# Patient Record
Sex: Male | Born: 1955 | Race: White | Hispanic: No | Marital: Married | Smoking: Never smoker
Health system: Southern US, Community
[De-identification: ages and names within clinical notes are randomized; demographics above are authoritative.]

## PROBLEM LIST (undated history)

## (undated) DIAGNOSIS — Z955 Presence of coronary angioplasty implant and graft: Secondary | ICD-10-CM

## (undated) DIAGNOSIS — M2550 Pain in unspecified joint: Secondary | ICD-10-CM

## (undated) DIAGNOSIS — R001 Bradycardia, unspecified: Secondary | ICD-10-CM

## (undated) DIAGNOSIS — E785 Hyperlipidemia, unspecified: Secondary | ICD-10-CM

## (undated) DIAGNOSIS — I519 Heart disease, unspecified: Secondary | ICD-10-CM

## (undated) DIAGNOSIS — I2109 ST elevation (STEMI) myocardial infarction involving other coronary artery of anterior wall: Secondary | ICD-10-CM

## (undated) DIAGNOSIS — I2510 Atherosclerotic heart disease of native coronary artery without angina pectoris: Secondary | ICD-10-CM

## (undated) DIAGNOSIS — I252 Old myocardial infarction: Secondary | ICD-10-CM

## (undated) HISTORY — DX: Presence of coronary angioplasty implant and graft: Z95.5

## (undated) HISTORY — DX: ST elevation (STEMI) myocardial infarction involving other coronary artery of anterior wall: I21.09

## (undated) HISTORY — PX: VASECTOMY: SHX75

## (undated) HISTORY — DX: Bradycardia, unspecified: R00.1

## (undated) HISTORY — DX: Heart disease, unspecified: I51.9

## (undated) HISTORY — DX: Pain in unspecified joint: M25.50

## (undated) HISTORY — DX: Hyperlipidemia, unspecified: E78.5

## (undated) HISTORY — DX: Old myocardial infarction: I25.2

## (undated) HISTORY — DX: Atherosclerotic heart disease of native coronary artery without angina pectoris: I25.10

---

## 2003-04-04 ENCOUNTER — Emergency Department (HOSPITAL_COMMUNITY): Admission: EM | Admit: 2003-04-04 | Discharge: 2003-04-04 | Payer: Self-pay | Admitting: Emergency Medicine

## 2003-04-04 ENCOUNTER — Encounter: Payer: Self-pay | Admitting: Emergency Medicine

## 2011-02-17 ENCOUNTER — Emergency Department (HOSPITAL_COMMUNITY): Payer: 59

## 2011-02-17 ENCOUNTER — Inpatient Hospital Stay (HOSPITAL_COMMUNITY)
Admission: EM | Admit: 2011-02-17 | Discharge: 2011-02-19 | DRG: 249 | Disposition: A | Discharge status: Home / Self Care | Payer: 59 | Attending: Cardiology | Admitting: Cardiology

## 2011-02-17 ENCOUNTER — Inpatient Hospital Stay (HOSPITAL_COMMUNITY): Payer: 59

## 2011-02-17 DIAGNOSIS — I498 Other specified cardiac arrhythmias: Secondary | ICD-10-CM | POA: Diagnosis present

## 2011-02-17 DIAGNOSIS — I2510 Atherosclerotic heart disease of native coronary artery without angina pectoris: Secondary | ICD-10-CM

## 2011-02-17 DIAGNOSIS — M549 Dorsalgia, unspecified: Secondary | ICD-10-CM | POA: Diagnosis present

## 2011-02-17 DIAGNOSIS — Z955 Presence of coronary angioplasty implant and graft: Secondary | ICD-10-CM

## 2011-02-17 DIAGNOSIS — I519 Heart disease, unspecified: Secondary | ICD-10-CM | POA: Diagnosis present

## 2011-02-17 DIAGNOSIS — I252 Old myocardial infarction: Secondary | ICD-10-CM

## 2011-02-17 DIAGNOSIS — Z8249 Family history of ischemic heart disease and other diseases of the circulatory system: Secondary | ICD-10-CM

## 2011-02-17 DIAGNOSIS — I2109 ST elevation (STEMI) myocardial infarction involving other coronary artery of anterior wall: Principal | ICD-10-CM | POA: Diagnosis present

## 2011-02-17 DIAGNOSIS — IMO0002 Reserved for concepts with insufficient information to code with codable children: Secondary | ICD-10-CM | POA: Diagnosis present

## 2011-02-17 DIAGNOSIS — E785 Hyperlipidemia, unspecified: Secondary | ICD-10-CM | POA: Diagnosis present

## 2011-02-17 DIAGNOSIS — Y840 Cardiac catheterization as the cause of abnormal reaction of the patient, or of later complication, without mention of misadventure at the time of the procedure: Secondary | ICD-10-CM | POA: Diagnosis present

## 2011-02-17 DIAGNOSIS — R0789 Other chest pain: Secondary | ICD-10-CM

## 2011-02-17 HISTORY — PX: CORONARY STENT PLACEMENT: SHX1402

## 2011-02-17 HISTORY — DX: Old myocardial infarction: I25.2

## 2011-02-17 HISTORY — DX: Presence of coronary angioplasty implant and graft: Z95.5

## 2011-02-17 LAB — BASIC METABOLIC PANEL
BUN: 20 mg/dL (ref 6–23)
CO2: 24 mEq/L (ref 19–32)
Calcium: 9.1 mg/dL (ref 8.4–10.5)
Creatinine, Ser: 0.83 mg/dL (ref 0.4–1.5)
GFR calc non Af Amer: >60 mL/min (ref >60)
Glucose, Bld: 163 mg/dL — ABNORMAL HIGH (ref 70–99)

## 2011-02-17 LAB — COMPREHENSIVE METABOLIC PANEL
BUN: 18 mg/dL (ref 6–23)
CO2: 22 mEq/L (ref 19–32)
Chloride: 98 mEq/L (ref 96–112)
Creatinine, Ser: 0.65 mg/dL (ref 0.4–1.5)
GFR calc non Af Amer: >60 mL/min (ref >60)
Total Bilirubin: 1 mg/dL (ref 0.3–1.2)

## 2011-02-17 LAB — DIFFERENTIAL
Basophils Absolute: 0 10*3/uL (ref 0.0–0.1)
Basophils Relative: 0 % (ref 0–1)
Eosinophils Relative: 3 % (ref 0–5)
Lymphocytes Relative: 34 % (ref 12–46)
Monocytes Absolute: 0.4 10*3/uL (ref 0.1–1.0)
Neutro Abs: 5 10*3/uL (ref 1.7–7.7)

## 2011-02-17 LAB — BRAIN NATRIURETIC PEPTIDE: Pro B Natriuretic peptide (BNP): <30 pg/mL (ref 0.0–100.0)

## 2011-02-17 LAB — CBC
HCT: 42.9 % (ref 39.0–52.0)
MCHC: 34.5 g/dL (ref 30.0–36.0)
RDW: 12.6 % (ref 11.5–15.5)
WBC: 8.6 10*3/uL (ref 4.0–10.5)

## 2011-02-17 LAB — POCT CARDIAC MARKERS
CKMB, poc: 2.6 ng/mL (ref 1.0–8.0)
Myoglobin, poc: 106 ng/mL (ref 12–200)

## 2011-02-17 LAB — APTT: aPTT: 24 seconds (ref 24–37)

## 2011-02-17 LAB — TSH: TSH: 2.113 u[IU]/mL (ref 0.350–4.500)

## 2011-02-17 LAB — CARDIAC PANEL(CRET KIN+CKTOT+MB+TROPI)
CK, MB: 6.1 ng/mL (ref 0.3–4.0)
CK, MB: 106.7 ng/mL (ref 0.3–4.0)
Total CK: 195 U/L (ref 7–232)
Total CK: 899 U/L — ABNORMAL HIGH (ref 7–232)

## 2011-02-17 LAB — PROTIME-INR: INR: 1 (ref 0.00–1.49)

## 2011-02-18 DIAGNOSIS — I219 Acute myocardial infarction, unspecified: Secondary | ICD-10-CM

## 2011-02-18 DIAGNOSIS — I2109 ST elevation (STEMI) myocardial infarction involving other coronary artery of anterior wall: Secondary | ICD-10-CM

## 2011-02-18 LAB — CARDIAC PANEL(CRET KIN+CKTOT+MB+TROPI)
Relative Index: 8.2 — ABNORMAL HIGH (ref 0.0–2.5)
Total CK: 1921 U/L — ABNORMAL HIGH (ref 7–232)

## 2011-02-18 LAB — LIPID PANEL
Cholesterol: 194 mg/dL (ref 0–200)
HDL: 40 mg/dL (ref >39)

## 2011-02-18 LAB — BASIC METABOLIC PANEL
BUN: 16 mg/dL (ref 6–23)
GFR calc non Af Amer: >60 mL/min (ref >60)
Potassium: 3.9 mEq/L (ref 3.5–5.1)
Sodium: 136 mEq/L (ref 135–145)

## 2011-02-18 LAB — CBC
Platelets: 242 10*3/uL (ref 150–400)
RDW: 12.9 % (ref 11.5–15.5)
WBC: 14.4 10*3/uL — ABNORMAL HIGH (ref 4.0–10.5)

## 2011-02-18 NOTE — Procedures (Signed)
NAME:  Patrick Berg, DUBREE NO.:  [id number]  MEDICAL RECORD NO.:  [id number]           PATIENT TYPE:  E  LOCATION:  [facility]                         FACILITY:  [facility]  PHYSICIAN:  Kentaro Alewine M. [place], M.D.  DATE OF BIRTH:  [birth year]  DATE OF PROCEDURE:  02/17/2011 DATE OF DISCHARGE:                           CARDIAC CATHETERIZATION   INDICATIONS FOR PROCEDURE:  A 55 year old white male with history of hypercholesterolemia who presents with an acute ST elevation anterior myocardial infarction.  The patient has marked ST elevation in the anterior precordial leads.  PROCEDURES:  Left heart catheterization, coronary and left ventricular angiography.  ACCESS:  Via the right femoral artery using standard Seldinger technique.  EQUIPMENT:  A 6-French 4 cm right and left Judkins catheter, 6-French pigtail catheter, 6-French arterial sheath, 6-French XB LAD guide with side holes, a Prowater wire, a 3.0 x 12-mm apex balloon, a 4.0 x 16 mm VeriFLEX stent and a 4.5 x 12-mm [place] Quantum balloon.  MEDICATIONS:  Angiomax bolus of 0.75 mg/kg followed by continuous infusion of 1.75 mg/kg/hour.  Subsequent ACT was 464 seconds, Versed 2 mg IV, Effient 60 mg p.o.  CONTRAST:  165 mL of Omnipaque.  HEMODYNAMIC DATA:  Aortic pressure was 141/77 with a mean of 105, left ventricular pressure was 141 with an EDP of 25 mmHg.  ANGIOGRAPHIC DATA:  The left coronary artery arises and distributes normally.  The left main coronary artery is normal.  The left anterior descending artery is a very large vessel which wraps around the apex and supplies the inferior wall.  It has a 95% eccentric stenosis in the proximal vessel.  There is an abrupt cutoff of the very distal LAD on the inferior wall.  There are three relatively small diagonal branches without significant disease.  The left circumflex coronary artery gives rise to a single marginal branch.  There is a 70-80% stenosis in the mid left  circumflex artery. This is segmental.  The right coronary artery arises and distributes normally.  There is 30- 40% irregularity in the midvessel.  No collaterals were seen to the LAD.  We proceeded at this point with emergent intervention of the LAD.  The lesion was crossed easily with a Prowater wire.  We predilated the lesion using a 3.0 x 12-mm apex balloon to 6 atmospheres x2.  We then deployed a 4.0 x 16 mm VeriFLEX stent at 9 atmospheres.  We postdilated the stent using a 4.5 x 12-mm [place] Quantum balloon up to 14 atmospheres. This yielded an excellent angiographic result with 0% residual stenosis and TIMI grade 3 flow.  There was no change in the very distal cutoff of the LAD on the inferior wall.  There was some narrowing of the second diagonal branch which was covered by the stent but there was still good antegrade flow into this vessel.  Left ventricular angiography was performed in the RAO view.  This demonstrates normal left ventricular size with moderate anterior and apical hypokinesia and severe hypokinesia of the distal inferior wall. Ejection fraction is estimated at 40%.  At this point, we removed the sheath and closed the right groin  using Angio-Seal device with excellent hemostasis.  FINAL INTERPRETATION: 1. Two-vessel obstructive coronary artery disease. 2. Moderate left ventricular dysfunction. 3. Successful stenting of the proximal LAD using a bare metal stent.  PLAN:  The patient will be transferred to the coronary intensive care unit and continued on aspirin and Effient.  We will reduce those Angiomax for the next 2 hours.  He will be placed on beta-blocker and statin therapy.  Would consider ACE inhibitor tomorrow if blood pressure is stable.          ______________________________ Deborh Pense M. [place], M.D.     PMJ/MEDQ  D:  02/17/2011  T:  02/17/2011  Job:  [id number]  Electronically Signed by Hanson Medeiros [place] M.D. on 02/18/2011 10:37:56 AM

## 2011-02-19 LAB — BASIC METABOLIC PANEL
BUN: 17 mg/dL (ref 6–23)
Chloride: 103 mEq/L (ref 96–112)
GFR calc Af Amer: >60 mL/min (ref >60)
Potassium: 4 mEq/L (ref 3.5–5.1)
Sodium: 138 mEq/L (ref 135–145)

## 2011-02-19 LAB — CBC
MCV: 92.7 fL (ref 78.0–100.0)
Platelets: 213 10*3/uL (ref 150–400)
RBC: 4.4 MIL/uL (ref 4.22–5.81)
WBC: 12 10*3/uL — ABNORMAL HIGH (ref 4.0–10.5)

## 2011-02-19 LAB — PROTIME-INR
INR: 1.07 (ref 0.00–1.49)
Prothrombin Time: 14.1 seconds (ref 11.6–15.2)

## 2011-02-19 LAB — POCT ACTIVATED CLOTTING TIME: Activated Clotting Time: 464 seconds

## 2011-03-05 ENCOUNTER — Ambulatory Visit: Payer: 59 | Admitting: Nurse Practitioner

## 2011-03-05 NOTE — Discharge Summary (Signed)
NAME:  Patrick Berg, BARIS NO.:  [id number]  MEDICAL RECORD NO.:  [id number]           PATIENT TYPE:  I  LOCATION:  3729                         FACILITY:  [facility]  PHYSICIAN:  Odin Mariani M. [place], M.D.  DATE OF BIRTH:  Oct 27, 1956  DATE OF ADMISSION:  02/17/2011 DATE OF DISCHARGE:  02/19/2011                              DISCHARGE SUMMARY   PRIMARY CARDIOLOGIST:  Dr. Latorie Montesano [place] (new).  PRIMARY CARE PHYSICIAN:  Located in [place].  DISCHARGE DIAGNOSES: 1. Acute anterior ST elevation myocardial infarction.     a.     Cardiac catheterization February 17, 2011:  Two-vessel      obstructive coronary artery disease (left anterior descending),      very large vessel wrapping to the apex supplying the inferior      wall, 95% eccentric stenosis in the proximal vessel, abrupt cutoff      of the very distal left anterior descending on the inferior wall,      three relatively small diagonal branches without significant      disease.  Left circumflex artery gives rise to single obtuse      marginal branch, 70-80% mid left circumflex stenosis, segmental.      Right coronary artery distributes normally, 30-40% irregularity in      the mid vessel, no collateral seen to left anterior descending.  (Moderate left ventricular dysfunction).  (Left ventricular ejection fraction 40% with moderate anterior and apical hypokinesis and severe hypokinesis of the distal inferior wall).  Successful stenting of the proximal left anterior descending using a bare-metal stent (4.0 x 16 mm VeriFLEX).  b.  A 2-D echocardiogram February 18, 2011:  left ventricular ejection fraction 50-55% with hypokinesis/akinesis of the mid inferior wall, cavity size normal, mild left ventricular hypertrophy.  SECONDARY DIAGNOSES: 1. Dyslipidemia.     a.     Total cholesterol 194, triglycerides 85, HDL 40, LDL 137,      total cholesterol/HDL ratio 4.9.  Crestor 40 mg p.o. nightly      initiated. 2. Status post  vasectomy.  ALLERGIES AND INTOLERANCES:  NAPROXEN (nausea/vomiting).  PROCEDURES: 1. EKG February 17, 2011:  Sinus bradycardia, 53 bpm, 4-6 mm of ST     elevation in V3-V6, minimal ST elevation in lead II, also ST     elevation in 1 and aVL with no reciprocal changes.  PR 152, QRS 108     and QTC of 421.  No significant Q-waves, no prior tracing for     comparison. 2. Cardiac catheterization February 17, 2011:  Please see discharge     diagnoses #1 (a). 3. Chest x-ray on February 17, 2011:  Low lung volumes without acute     cardiopulmonary disease. 4. EKG on February 18, 2011:  Sinus bradycardia, 52 bpm, mild T-wave     inversion in V1, V3, biphasic T-waves in V2, V3, no other     significant ST-T-wave changes, no significant Q-waves, slight left     axis deviation, no evidence of hypertrophy, PR 134, QRS 84, and QTC     of 407. 5. A 2-D echocardiogram on February 18, 2011:  Please see discharge     diagnoses #1(b).  HISTORY OF PRESENT ILLNESS:  Patrick Berg is a 55 year old Caucasian gentleman with no known history of coronary artery disease with risk factors of hyperlipidemia and positive family history who presented to [facility] ED as a code STEMI after initially complaining of acute onset of chest discomfort, diaphoresis, nausea, and vomiting on date of presentation after having a bowel movement.  He was subsequently found to have ST elevation on EKG in anterolateral leads on initial tracing completed by EMS.  HOSPITAL COURSE:  The patient was admitted and underwent emergent cardiac catheterization with findings as described in discharge diagnoses #1(a).  He tolerated his catheterization well without significant complications except for mild hematoma at the cath site on the right groin but there was no bruit and no significant tenderness or evidence of bleeding and no further workup is planned at this time.  The patient was noted to have slight depressed LV function on his catheterization, had a  followup 2-D echocardiogram the next day that showed normal LVEF with wall motion abnormalities as described in discharge diagnoses #1 (a) and (b).  The patient was initiated on dual antiplatelet therapy in the setting of his bare-metal stent placement with aspirin and Effient after being loaded with Effient in the cath lab.  He was also initiated on low-dose beta-blockade therapy in the setting of his MI but mild sinus bradycardia on admission and throughout hospital stay.  The patient was also initiated on high-dose statin therapy given history of hyperlipidemia and as shown to have dyslipidemia this admission also in setting of MI.  The patient initiated on ACE inhibitor therapy on February 18, 2011, as blood pressure sufficient for addition of this medication.  The patient was kept one more day after working with Cardiac Rehab on February 18, 2011, education only.  On the morning of February 19, 2011, he was deemed stable for discharge by his new cardiologist and interventionalist Dr. Jozlyn Schatz [place].  Plan will be for him to follow up with Dr. [place] on April 19 with a basic metabolic panel and discussed possible PCI disease in left circumflex artery.  The patient will have liver function test and lipid panel drawn on May 15 given addition of high-dose statin.  Date of staged PCI of left circumflex to be determined by Dr. [place] at followup on April 19.  At the time of discharge, the patient received his new medication list, prescriptions, followup instructions, and postcath instructions.  All questions and concerns were addressed prior to leaving the hospital.  DISCHARGE LABORATORY FINDINGS:  WBC 12.0, HGB 13.8, HCT 40.8, PLT count is 213.  Protime 14.1, INR 1.07.  Sodium 138, potassium 4.0, chloride 103, bicarb 28, BUN 17, creatinine 0.77, glucose 97.  Liver function tests were within normal limits on date of presentation, albumin 3.9, calcium 8.7, magnesium 2.1, hemoglobin A1c of 5.3%.   Point-of-care markers negative x1.  First full set of enzymes; CK 195, MB 6.1 with a relative index of 3.1, troponin 0.18.  Second full set CK 899, MB 106.7, relative index 11.9, troponin 9.09.  Third full set CK 1921, MB 157.0 with relative index of 8.2, troponin-I 29.47, total cholesterol (see secondary diagnoses #1(a)).  TSH 2.113.  FOLLOWUP PLANS AND APPOINTMENTS:  Please see hospital course.  DISCHARGE MEDICATIONS: 1. Lisinopril 5 mg 1 tablet p.o. daily. 2. Metoprolol 25 mg one-half tablet p.o. b.i.d. 3. Sublingual nitroglycerin 0.4 mg every 5 minutes up to 3 doses  p.r.n. for chest discomfort. 4. Prasugrel 10 mg 1 tablet p.o. daily. 5. Rosuvastatin/Crestor 40 mg 1 tablet p.o. nightly. 6. Enteric-coated aspirin 325 mg p.o. daily. 7. Multivitamin 1 tablet daily. 8. APAP 500 mg 2 tablets p.o. q.6 h. p.r.n.  DURATION OF DISCHARGE ENCOUNTER:  Including physician time was 35 minutes.     Jarrett Ables, PAC   ______________________________ Karam Dunson M. [place], M.D.    MS/MEDQ  D:  02/19/2011  T:  02/20/2011  Job:  [id number]  cc:   Shantell Belongia M. [place], M.D.  Electronically Signed by Jarrett Ables PAC on 02/26/2011 10:39:51 AM Electronically Signed by Jerrel Tiberio [place] M.D. on 03/05/2011 03:14:19 PM

## 2011-03-05 NOTE — H&P (Signed)
NAME:  Patrick Berg, Patrick Berg NO.:  [id number]  MEDICAL RECORD NO.:  [id number]           PATIENT TYPE:  I  LOCATION:  2913                         FACILITY:  [facility]  PHYSICIAN:  Peter M. [place], M.D.  DATE OF BIRTH:  [birth year]  DATE OF ADMISSION:  02/17/2011 DATE OF DISCHARGE:                             HISTORY & PHYSICAL   CARDIOLOGIST:  The patient is new to Cardiology.  PRIMARY CARE PHYSICIAN:  In [place].  CHIEF COMPLAINT:  Chest discomfort.  HISTORY OF PRESENT ILLNESS:  Patrick Berg is a 55 year old Caucasian male with no known history of coronary artery disease, but risk factors including hyperlipidemia and question of positive family history (father with history of CAD but unknown age of diagnosis) who presents with acute onset of chest discomfort, diaphoresis, nausea, and vomiting today after having a bowel movement, subsequently found to have ST elevation on EKG in anterolateral leads.  Patrick Berg was in his usual state of health when he got up this morning, walked his dog, was not feeling especially poorly until after he had a bowel movement and became extremely diaphoretic, had nausea and vomiting, and had acute onset of substernal chest discomfort.  The patient was also short of breath and when he explained the symptoms his wife, she was about to drive him to the emergency department when he collapsed.  EMS was called and when they arrived initial EKG showed ST elevation in leads V2-V6, most pronounced in V4 and V5.  The patient was transferred emergently to [facility] after being given 4 baby aspirin and 4 mg of IV morphine.  He was then quickly evaluated by Dr. Peter [place] and brought to the Cardiac Cath Lab emergently.  Only labs available currently are one set of point-of-care markers which are negative and CBC which is totally within normal limits.  The patient's vital signs have been within normal limits except for a mildly low pulse.  Of note,  the patient has a 100% O2 saturation, but is on a nonrebreather.  Currently, he is in at least moderate distress and having difficulty communicating due to his chest discomfort and associated symptoms.  PAST MEDICAL HISTORY: 1. Hyperlipidemia. 2. S/p vasectomy.  SOCIAL HISTORY:  The patient lives with his wife.  He is not a current smoker, unknown if any significant history.  Unknown the EtOH, illicit drug use, or herbal meds.  Regular diet.  He is active, but is not exercising regularly other than walking.  FAMILY HISTORY:  Negative for premature diagnosis of coronary artery disease except for question in father who did have diagnosis of CAD, but unknown age of diagnosis.  REVIEW OF SYSTEMS:  Obtained from wife by Dr. [place] as the patient is in distress and having difficulty communicating.  Nothing out of the ordinary lately other than described in the HPI.  All other systems were reviewed and were negative.  CODE STATUS:  Full.  ALLERGIES:  IBUPROFEN (unknown reaction).  MEDICATIONS:  (?) Cholesterol medication only.  En route to the emergency department, given 81 mg x4, morphine 4 mg x1 and also given 2 sublingual nitroglycerin prior to arrival  in the Cath Lab.  PHYSICAL EXAMINATION:  VITAL SIGNS:  Temperature 97.8 degrees Fahrenheit, BP 106-113/67-77 with pulse rate in the 50s-60s, respiration rate 16-17, and O2 saturation is 100% on nonrebreather. GENERAL:  The patient is in moderate distress, but he is alert, no respiratory distress while on nonrebreather. HEENT:  Head is normocephalic and atraumatic.  Pupils are equal, round, and reactive to light.  Extraocular muscles are intact.  Nares are patent without discharge.  Oropharynx is not examined. NECK:  Supple without lymphadenopathy.  No thyromegaly.  No JVD. HEART:  Heart rate is regular, but slow in the 50s.  Audible S1 and S2. No clicks, rubs, murmurs, or gallops.  Pulses are 2+ and equal in both upper and lower  extremities bilaterally. LUNGS:  Clear to auscultation bilaterally. SKIN:  No rashes, lesions, or petechiae. ABDOMEN:  Soft, nontender, and nondistended.  Normal abdominal bowel sounds.  No rebound or guarding.  No hepatosplenomegaly. EXTREMITIES:  Without clubbing, cyanosis, or edema. MUSCULOSKELETAL:  Without joint deformity or effusions.  No spinal or CVA tenderness. NEURO:  Cranial nerves II-XII grossly intact.  Strength 5/5 in all extremities and axial groups.  Normal sensation throughout.  Normal cerebellar function (as assessed in the hospital bed only).  RADIOLOGY:  Chest x-ray pending.  EKG:  Rhythm is sinus bradycardia at a rate of 53 bpm with 4-6 mm ST depression in leads V3-V6, minimal ST elevation in lead II, also ST elevation in I and aVL with no reciprocal changes.  PR 152, QRS 108, and QTc of 421.  No significant Q-waves, no prior tracing for comparison.  LABORATORY DATA:  Point-of-care markers negative x1.  CBC with diff within normal limits.  ASSESSMENT/PLAN:  The patient is seen by Jarrett Ables, [facility] and attending cardiologist/interventionalist, Dr. Peter [place].  Patrick Berg is a 55 year old Caucasian gentleman with no known history of coronary artery disease and what appears to be minimal risk factors with only hyperlipidemia and question of positive family history who presents with acute anterolateral ST-elevation myocardial infarction.  ST-segment elevation myocardial infarction - the patient is currently undergoing emergent cardiac catheterization.  Initial films quickly reviewed prior to this dictation showed approximately 90+% stenosis of the proximal LAD.  Please see full cath report for details.  The patient has been loaded with Effient and will continue on dual antiplatelet therapy with full-strength aspirin and Effient 10 mg p.o. daily.  We will order beta-blockade therapy, but with hold parameters given his low heart rates.  Of note, his heart  rate did jump up as of last check into the 80s while on the cath lab table. We will initiate high-dose statin therapy with Crestor 40 mg p.o. nightly.  We will order morphine 2 mg IV q.2 h. p.r.n. and nitroglycerin and heparin per post-cath orders/interventional cardiologist.  We will check CMET, BNP, magnesium, hemoglobin A1c, PT/INR, and TSH today.  We will check BMET in the morning along with fasting lipids.  We will follow EKG tracings for the next 48 hours as well as cardiac enzymes over the next 24 hours.  The patient will be transferred to the Intensive Care Unit post-cath.  Further orders pending the final decisions by interventional cardiologist after reviewing total picture/results of catheterization.     Jarrett Ables, PAC   ______________________________ Peter M. [place], M.D.    MS/MEDQ  D:  02/17/2011  T:  02/17/2011  Job:  [id number]  Electronically Signed by Jarrett Ables PAC on 02/26/2011 10:41:11 AM Electronically Signed by PETER [place]  M.D. on 03/05/2011 03:14:27 PM

## 2011-03-06 ENCOUNTER — Encounter: Payer: Self-pay | Admitting: *Deleted

## 2011-03-06 DIAGNOSIS — R001 Bradycardia, unspecified: Secondary | ICD-10-CM

## 2011-03-06 DIAGNOSIS — I252 Old myocardial infarction: Secondary | ICD-10-CM

## 2011-03-06 DIAGNOSIS — I519 Heart disease, unspecified: Secondary | ICD-10-CM | POA: Insufficient documentation

## 2011-03-06 DIAGNOSIS — E785 Hyperlipidemia, unspecified: Secondary | ICD-10-CM

## 2011-03-07 ENCOUNTER — Encounter: Payer: Self-pay | Admitting: Cardiology

## 2011-03-07 ENCOUNTER — Ambulatory Visit (INDEPENDENT_AMBULATORY_CARE_PROVIDER_SITE_OTHER): Payer: 59 | Admitting: Cardiology

## 2011-03-07 VITALS — BP 110/60 | HR 56 | Ht 70.0 in | Wt 190.5 lb

## 2011-03-07 DIAGNOSIS — I519 Heart disease, unspecified: Secondary | ICD-10-CM

## 2011-03-07 DIAGNOSIS — I2510 Atherosclerotic heart disease of native coronary artery without angina pectoris: Secondary | ICD-10-CM

## 2011-03-07 DIAGNOSIS — I2109 ST elevation (STEMI) myocardial infarction involving other coronary artery of anterior wall: Secondary | ICD-10-CM

## 2011-03-07 DIAGNOSIS — E785 Hyperlipidemia, unspecified: Secondary | ICD-10-CM

## 2011-03-07 LAB — CBC WITH DIFFERENTIAL/PLATELET
Basophils Absolute: 0 10*3/uL (ref 0.0–0.1)
Eosinophils Absolute: 0.1 10*3/uL (ref 0.0–0.7)
Eosinophils Relative: 1.5 % (ref 0.0–5.0)
HCT: 38.9 % — ABNORMAL LOW (ref 39.0–52.0)
Lymphs Abs: 2.4 10*3/uL (ref 0.7–4.0)
MCHC: 34.1 g/dL (ref 30.0–36.0)
MCV: 96 fl (ref 78.0–100.0)
Monocytes Absolute: 0.5 10*3/uL (ref 0.1–1.0)
Platelets: 309 10*3/uL (ref 150.0–400.0)
RDW: 13.4 % (ref 11.5–14.6)

## 2011-03-07 LAB — BASIC METABOLIC PANEL
Calcium: 9.9 mg/dL (ref 8.4–10.5)
GFR: 116.72 mL/min (ref >60.00)
Glucose, Bld: 76 mg/dL (ref 70–99)
Sodium: 139 mEq/L (ref 135–145)

## 2011-03-07 LAB — PROTIME-INR: INR: 1.2 ratio — ABNORMAL HIGH (ref 0.8–1.0)

## 2011-03-07 NOTE — Patient Instructions (Signed)
Continue your current medications.  We will schedule you for a coronary stent next Friday.  Take a fish oil capsule daily.

## 2011-03-08 ENCOUNTER — Telehealth: Payer: Self-pay | Admitting: *Deleted

## 2011-03-08 ENCOUNTER — Other Ambulatory Visit: Payer: Self-pay | Admitting: *Deleted

## 2011-03-08 ENCOUNTER — Encounter: Payer: Self-pay | Admitting: Cardiology

## 2011-03-08 DIAGNOSIS — E7800 Pure hypercholesterolemia, unspecified: Secondary | ICD-10-CM

## 2011-03-08 DIAGNOSIS — I2109 ST elevation (STEMI) myocardial infarction involving other coronary artery of anterior wall: Secondary | ICD-10-CM | POA: Insufficient documentation

## 2011-03-08 DIAGNOSIS — I2510 Atherosclerotic heart disease of native coronary artery without angina pectoris: Secondary | ICD-10-CM | POA: Insufficient documentation

## 2011-03-08 NOTE — Assessment & Plan Note (Signed)
The patient is asymptomatic and doing well on his initial visit. He had successful stenting of the proximal LAD with a bare-metal stent.

## 2011-03-08 NOTE — Progress Notes (Signed)
Patrick Berg Date of Birth: 1956/11/09   History of Present Illness: Patrick Berg is seen for followup today after hospitalization from April 1 until the third. He presented with an acute anterior ST elevation myocardial infarction. The culprit lesion was a high grade eccentric stenosis in the proximal LAD which was successfully stented with a 4.0 x 16 mm Veriflex stent. He also had a 70-80% stenosis in the mid circumflex. His ejection fraction was reduced at 40%. Since discharge he has been doing well. He's had no recurrent chest pain. He does get tired if he overdoes it. He's had minimal shortness of breath. He did have one dizzy spell several days after he was discharged from the hospital but this has not recurred.  Current Outpatient Prescriptions on File Prior to Visit  Medication Sig Dispense Refill  . acetaminophen (TYLENOL) 500 MG tablet Take 1,000 mg by mouth every 6 (six) hours as needed. 2 tablets       . aspirin EC 325 MG EC tablet Take 325 mg by mouth daily.        Marland Kitchen lisinopril (PRINIVIL,ZESTRIL) 5 MG tablet Take 5 mg by mouth daily.        . metoprolol tartrate (LOPRESSOR) 25 MG tablet Take 12.5 mg by mouth. 1/2 tablet 2 (two) times daily       . multivitamin (THERAGRAN) per tablet Take 1 tablet by mouth daily.        . nitroGLYCERIN (NITROSTAT) 0.4 MG SL tablet Place 0.4 mg under the tongue every 5 (five) minutes as needed.        . prasugrel (EFFIENT) 10 MG TABS Take 10 mg by mouth daily.        . rosuvastatin (CRESTOR) 40 MG tablet Take 40 mg by mouth daily.          Allergies  Allergen Reactions  . Naproxen Nausea And Vomiting    Past Medical History  Diagnosis Date  . Hyperlipidemia   . Dyslipidemia   . Myocardial infarction, anterior wall, subsequent care   . Bradycardia   . Ventricular dysfunction     left (mild)  . Coronary artery disease   . Hypotension     Past Surgical History  Procedure Date  . Cardiac catheterization 02/17/2011    with stent  placement  . Vasectomy     History  Smoking status  . Never Smoker   Smokeless tobacco  . Never Used    History  Alcohol Use No    Family History  Problem Relation Age of Onset  . Anemia Mother   . Cancer Mother     liver / lung  . Anxiety disorder Mother   . Anxiety disorder Father   . Heart attack Father   . Heart failure Father   . Hyperlipidemia Mother     Review of Systems: The review of systems is positive for insomnia.  He also complains of a mild cough.All other systems were reviewed and are negative.  Physical Exam: BP 110/60  Pulse 56  Ht 5\' 10"  (1.778 m)  Wt 190 lb 8 oz (86.41 kg)  BMI 27.33 kg/m2 The patient is alert and oriented x 3.  The mood and affect are normal.  The skin is warm and dry.  Color is normal.  The HEENT exam reveals that the sclera are nonicteric.  The mucous membranes are moist.  The carotids are 2+ without bruits.  There is no thyromegaly.  There is no JVD.  The lungs are  clear.  The chest wall is non tender.  The heart exam reveals a regular rate with a normal S1 and S2.  There are no murmurs, gallops, or rubs.  The PMI is not displaced.   Abdominal exam reveals good bowel sounds.  There is no guarding or rebound.  There is no hepatosplenomegaly or tenderness.  There are no masses. There is no groin hematoma at his catheter site.  Exam of the legs reveal no clubbing, cyanosis, or edema.  The legs are without rashes.  The distal pulses are intact.  Cranial nerves II - XII are intact.  Motor and sensory functions are intact.  The gait is normal.  LABORATORY DATA: ECG today demonstrates an ectopic atrial rhythm with a rate of 54 beats per minute. He has T-wave inversions in leads 3 and aVF consistent with inferior wall ischemia.  Assessment / Plan:

## 2011-03-08 NOTE — Assessment & Plan Note (Signed)
Patient has a residual 70-80% stenosis in the left circumflex coronary. He will be admitted as an outpatient next Friday, April 27 for stenting of this vessel. We will reassess his LV function at that time. After this lesion is treated we will refer the patient to begin cardiac rehabilitation therapy. He will need to continue on aspirin and Effient for one year.

## 2011-03-08 NOTE — Assessment & Plan Note (Signed)
Continue therapy with beta blockers and ACE inhibitors. Hopefully there'll be further recovery and his LV function after stenting.

## 2011-03-08 NOTE — Assessment & Plan Note (Signed)
On high dose Crestor. We will recheck lipid panel in 2 months.

## 2011-03-08 NOTE — Telephone Encounter (Signed)
Message copied by Murrell Redden on Fri Mar 08, 2011  2:57 PM ------      Message from: [place], PETER      Created: Fri Mar 08, 2011 12:22 PM       Labs ok for PCI next week.

## 2011-03-08 NOTE — Telephone Encounter (Signed)
Notified all lab results good for PCI next week.

## 2011-03-13 ENCOUNTER — Encounter: Payer: Self-pay | Admitting: Cardiology

## 2011-03-15 ENCOUNTER — Observation Stay (HOSPITAL_COMMUNITY)
Admission: RE | Admit: 2011-03-15 | Discharge: 2011-03-16 | Disposition: A | Discharge status: Home / Self Care | Payer: 59 | Source: Ambulatory Visit | Attending: Cardiology | Admitting: Cardiology

## 2011-03-15 DIAGNOSIS — I2510 Atherosclerotic heart disease of native coronary artery without angina pectoris: Principal | ICD-10-CM | POA: Insufficient documentation

## 2011-03-15 DIAGNOSIS — I2589 Other forms of chronic ischemic heart disease: Secondary | ICD-10-CM | POA: Insufficient documentation

## 2011-03-15 DIAGNOSIS — I498 Other specified cardiac arrhythmias: Secondary | ICD-10-CM | POA: Insufficient documentation

## 2011-03-15 DIAGNOSIS — I2109 ST elevation (STEMI) myocardial infarction involving other coronary artery of anterior wall: Secondary | ICD-10-CM | POA: Insufficient documentation

## 2011-03-15 DIAGNOSIS — Z9861 Coronary angioplasty status: Secondary | ICD-10-CM | POA: Insufficient documentation

## 2011-03-15 DIAGNOSIS — E785 Hyperlipidemia, unspecified: Secondary | ICD-10-CM | POA: Insufficient documentation

## 2011-03-15 HISTORY — PX: CORONARY STENT PLACEMENT: SHX1402

## 2011-03-16 LAB — BASIC METABOLIC PANEL
Chloride: 105 mEq/L (ref 96–112)
GFR calc Af Amer: >60 mL/min (ref >60)
Potassium: 4.2 mEq/L (ref 3.5–5.1)
Sodium: 135 mEq/L (ref 135–145)

## 2011-03-16 LAB — CBC
Platelets: 180 10*3/uL (ref 150–400)
RBC: 4.26 MIL/uL (ref 4.22–5.81)
WBC: 7.1 10*3/uL (ref 4.0–10.5)

## 2011-03-17 NOTE — Cardiovascular Report (Signed)
  NAME:  Patrick Berg, Patrick Berg NO.:  [id number]  MEDICAL RECORD NO.:  [id number]           PATIENT TYPE:  O  LOCATION:  6524                         FACILITY:  [facility]  PHYSICIAN:  Peter M. Swaziland, M.D.  DATE OF BIRTH:  10/04/1956  DATE OF PROCEDURE:  03/15/2011 DATE OF DISCHARGE:                           CARDIAC CATHETERIZATION   INDICATIONS FOR PROCEDURE:  A 55 year old white male who has had prior anterior myocardial infarction in early April, treated with direct stenting of the proximal LAD with a bare metal stent.  He was noted at that time to have an 80% stenosis in the midcircumflex.  He returns for revascularization of that vessel.  Access via the right radial artery using the standard Seldinger technique equipment 6-French XV LAD 3.5 guide, a 6-French arterial sheath, Prowater wire, a 2.5 x 15 apex balloon, a 2.75 x 20 mm VeriFLEX stent, and a 3.0 x 15-mm [place] Quantum Apex balloon.  MEDICATIONS:  Local anesthesia 1% Xylocaine, verapamil 3 mg intra- arterial, Angiomax bolus of 0.75 mg/kg with continuous infusion of 1.75 mg/kg per hour.  Subsequent ACT was 376 seconds, Versed 2 mg IV, fentanyl 25 mcg IV, contrast 60 mL of Omnipaque.  HEMODYNAMIC DATA:  The patient's blood pressure was 95/50 throughout the procedure.  PROCEDURE NOTE:  After initial guide shots were obtained, we were able to cross the lesion at the midcircumflex without difficulty.  We predilated the lesion in the midvessel using 2.5 x 15-mm apex balloon to 6 atmospheres.  We then deployed a 2.75 x 20 mm VeriFLEX stent at 9 atmospheres.  The stent was then postdilated using a 3.0 x 15-mm [place] Quantum Apex balloon to 12 atmospheres x2.  This yielded an excellent angiographic result with 0% residual stenosis and TIMI grade 3 flow. The patient had no complications.  FINAL INTERPRETATION:  Successful intracoronary stenting of the midcircumflex using a bare metal stent.     ______________________________ Peter M. Swaziland, M.D.     PMJ/MEDQ  D:  03/15/2011  T:  03/16/2011  Job:  [id number]  Electronically Signed by PETER Swaziland M.D. on 03/17/2011 12:40:22 PM

## 2011-03-18 ENCOUNTER — Other Ambulatory Visit: Payer: Self-pay | Admitting: *Deleted

## 2011-03-18 ENCOUNTER — Telehealth: Payer: Self-pay | Admitting: *Deleted

## 2011-03-18 ENCOUNTER — Encounter (HOSPITAL_COMMUNITY): Payer: 59

## 2011-03-18 DIAGNOSIS — E7800 Pure hypercholesterolemia, unspecified: Secondary | ICD-10-CM

## 2011-03-18 MED ORDER — ROSUVASTATIN CALCIUM 40 MG PO TABS
40.0000 mg | ORAL_TABLET | Freq: Every day | ORAL | Status: DC
Start: 2011-03-18 — End: 2011-04-05

## 2011-03-18 NOTE — Telephone Encounter (Signed)
Called to give him an app post cath/stent. Needs refill on Crestor. Sent in

## 2011-03-20 ENCOUNTER — Encounter (HOSPITAL_COMMUNITY): Payer: 59

## 2011-03-22 ENCOUNTER — Encounter (HOSPITAL_COMMUNITY): Payer: 59

## 2011-03-25 ENCOUNTER — Telehealth: Payer: Self-pay | Admitting: Cardiology

## 2011-03-25 ENCOUNTER — Encounter (HOSPITAL_COMMUNITY): Payer: 59 | Attending: Cardiology

## 2011-03-25 ENCOUNTER — Ambulatory Visit (HOSPITAL_COMMUNITY): Payer: 59

## 2011-03-25 DIAGNOSIS — Z8249 Family history of ischemic heart disease and other diseases of the circulatory system: Secondary | ICD-10-CM | POA: Insufficient documentation

## 2011-03-25 DIAGNOSIS — I2109 ST elevation (STEMI) myocardial infarction involving other coronary artery of anterior wall: Secondary | ICD-10-CM | POA: Insufficient documentation

## 2011-03-25 DIAGNOSIS — Z5189 Encounter for other specified aftercare: Secondary | ICD-10-CM | POA: Insufficient documentation

## 2011-03-25 DIAGNOSIS — I498 Other specified cardiac arrhythmias: Secondary | ICD-10-CM | POA: Insufficient documentation

## 2011-03-25 DIAGNOSIS — I2510 Atherosclerotic heart disease of native coronary artery without angina pectoris: Secondary | ICD-10-CM | POA: Insufficient documentation

## 2011-03-25 DIAGNOSIS — I517 Cardiomegaly: Secondary | ICD-10-CM | POA: Insufficient documentation

## 2011-03-25 DIAGNOSIS — E785 Hyperlipidemia, unspecified: Secondary | ICD-10-CM | POA: Insufficient documentation

## 2011-03-25 DIAGNOSIS — Z9861 Coronary angioplasty status: Secondary | ICD-10-CM | POA: Insufficient documentation

## 2011-03-25 NOTE — Telephone Encounter (Signed)
Called wanting to know what dose of ASA he should be on. According to D/C summary is 81 mg. He is also on Effient due to stent placement. Advised to continue on 81 mg. Also wants to get all his meds transferred to [facility]. Will see 5/18 and can change meds that need to be refilled at that time. Advised to bring all meds with him to visit.

## 2011-03-25 NOTE — Telephone Encounter (Signed)
Was calling wanting to know how much asprin her husband is supposed to take because he went into the hospital and they told him a different amount than the one prescribed here.  And both Gerlene Burdock and Clarisse Gouge wanted to know what information they needed to provide in order to switch their pharmaceutical providers to [facility]. Please call back. I couldn't find the files.

## 2011-03-27 ENCOUNTER — Encounter (HOSPITAL_COMMUNITY): Payer: 59

## 2011-03-29 ENCOUNTER — Encounter (HOSPITAL_COMMUNITY): Payer: 59

## 2011-04-01 ENCOUNTER — Ambulatory Visit: Payer: 59 | Admitting: Nurse Practitioner

## 2011-04-01 ENCOUNTER — Encounter (HOSPITAL_COMMUNITY): Payer: 59

## 2011-04-02 ENCOUNTER — Other Ambulatory Visit: Payer: 59 | Admitting: *Deleted

## 2011-04-03 ENCOUNTER — Encounter (HOSPITAL_COMMUNITY): Payer: 59

## 2011-04-05 ENCOUNTER — Ambulatory Visit (INDEPENDENT_AMBULATORY_CARE_PROVIDER_SITE_OTHER): Payer: 59 | Admitting: Nurse Practitioner

## 2011-04-05 ENCOUNTER — Other Ambulatory Visit (INDEPENDENT_AMBULATORY_CARE_PROVIDER_SITE_OTHER): Payer: 59 | Admitting: *Deleted

## 2011-04-05 ENCOUNTER — Other Ambulatory Visit: Payer: Self-pay | Admitting: *Deleted

## 2011-04-05 ENCOUNTER — Encounter (HOSPITAL_COMMUNITY): Payer: 59

## 2011-04-05 ENCOUNTER — Encounter: Payer: Self-pay | Admitting: Nurse Practitioner

## 2011-04-05 VITALS — BP 106/70 | HR 68 | Ht 70.0 in | Wt 190.0 lb

## 2011-04-05 DIAGNOSIS — R0600 Dyspnea, unspecified: Secondary | ICD-10-CM

## 2011-04-05 DIAGNOSIS — I2510 Atherosclerotic heart disease of native coronary artery without angina pectoris: Secondary | ICD-10-CM

## 2011-04-05 DIAGNOSIS — R0609 Other forms of dyspnea: Secondary | ICD-10-CM

## 2011-04-05 DIAGNOSIS — R0989 Other specified symptoms and signs involving the circulatory and respiratory systems: Secondary | ICD-10-CM

## 2011-04-05 DIAGNOSIS — E7800 Pure hypercholesterolemia, unspecified: Secondary | ICD-10-CM

## 2011-04-05 DIAGNOSIS — R0602 Shortness of breath: Secondary | ICD-10-CM

## 2011-04-05 DIAGNOSIS — E785 Hyperlipidemia, unspecified: Secondary | ICD-10-CM

## 2011-04-05 DIAGNOSIS — I5020 Unspecified systolic (congestive) heart failure: Secondary | ICD-10-CM | POA: Insufficient documentation

## 2011-04-05 LAB — HEPATIC FUNCTION PANEL
Albumin: 4.2 g/dL (ref 3.5–5.2)
Alkaline Phosphatase: 51 U/L (ref 39–117)
Total Protein: 6.8 g/dL (ref 6.0–8.3)

## 2011-04-05 LAB — LIPID PANEL
Cholesterol: 97 mg/dL (ref 0–200)
HDL: 34.2 mg/dL — ABNORMAL LOW (ref >39.00)
LDL Cholesterol: 43 mg/dL (ref 0–99)
Triglycerides: 101 mg/dL (ref 0.0–149.0)
VLDL: 20.2 mg/dL (ref 0.0–40.0)

## 2011-04-05 LAB — BASIC METABOLIC PANEL
BUN: 17 mg/dL (ref 6–23)
CO2: 27 mEq/L (ref 19–32)
Calcium: 9.2 mg/dL (ref 8.4–10.5)
Chloride: 103 mEq/L (ref 96–112)
Creatinine, Ser: 0.6 mg/dL (ref 0.4–1.5)
GFR: 151.55 mL/min (ref >60.00)
Glucose, Bld: 80 mg/dL (ref 70–99)
Potassium: 4.3 mEq/L (ref 3.5–5.1)
Sodium: 137 mEq/L (ref 135–145)

## 2011-04-05 LAB — BRAIN NATRIURETIC PEPTIDE: Pro B Natriuretic peptide (BNP): 13 pg/mL (ref 0.0–100.0)

## 2011-04-05 MED ORDER — PRASUGREL HCL 10 MG PO TABS: 10.0000 mg | ORAL_TABLET | Freq: Every day | ORAL | Status: DC

## 2011-04-05 MED ORDER — LOSARTAN POTASSIUM 25 MG PO TABS: ORAL_TABLET | ORAL | Status: DC

## 2011-04-05 MED ORDER — ROSUVASTATIN CALCIUM 20 MG PO TABS: 20.0000 mg | ORAL_TABLET | Freq: Every day | ORAL | Status: DC

## 2011-04-05 NOTE — Assessment & Plan Note (Signed)
Doing well from our standpoint. I don't think he will tolerate up titration of his ARB. Will continue with cardiac rehab. Will continue Effient for one year. Will need to consider repeat echo when he is about 3 months out from his MI. He will continue to monitor his blood pressure. Patient is agreeable to this plan and will call if any problems develop in the interim.

## 2011-04-05 NOTE — Progress Notes (Signed)
Patrick Berg Date of Birth: 23-Jul-1956   History of Present Illness: [name] is seen today for a post hospital visit. He is seen for Dr. Swaziland. He had an anterior MI at the first of April and had a BMS to the LAD. He was brought back at the end of April and had a BMS to the mid LCX. He is committed to Effient for the next one year. He is doing ok. He is not having chest pain. He does feel achy all over and thinks it is due to the high dose of Crestor. His blood pressure is in the low 100's and he still feels a little foggy at times. He is doing cardiac rehab and making progress. He is no longer on beta blocker because of bradycardia. He is only on low dose ARB therapy. He was cathed through the arm and has had no issues.   Current Outpatient Prescriptions on File Prior to Visit  Medication Sig Dispense Refill  . acetaminophen (TYLENOL) 500 MG tablet Take 1,000 mg by mouth every 6 (six) hours as needed. 2 tablets       . co-enzyme Q-10 30 MG capsule Take 100 mg by mouth 3 (three) times daily.        . fish oil-omega-3 fatty acids 1000 MG capsule Take 1 g by mouth daily.        Marland Kitchen losartan (COZAAR) 25 MG tablet Take 25 mg by mouth daily. 1/2 tablet daily       . multivitamin (THERAGRAN) per tablet Take 1 tablet by mouth daily.        . nitroGLYCERIN (NITROSTAT) 0.4 MG SL tablet Place 0.4 mg under the tongue every 5 (five) minutes as needed.        . prasugrel (EFFIENT) 10 MG TABS Take 10 mg by mouth daily.        Marland Kitchen DISCONTD: rosuvastatin (CRESTOR) 40 MG tablet Take 1 tablet (40 mg total) by mouth daily.  30 tablet  5  . DISCONTD: aspirin EC 325 MG EC tablet Take 325 mg by mouth daily.       Marland Kitchen DISCONTD: lisinopril (PRINIVIL,ZESTRIL) 5 MG tablet Take 5 mg by mouth daily.        Marland Kitchen DISCONTD: metoprolol tartrate (LOPRESSOR) 25 MG tablet Take 12.5 mg by mouth. 1/2 tablet 2 (two) times daily         Allergies  Allergen Reactions  . Ace Inhibitors     cough  . Naproxen Nausea And Vomiting      Past Medical History  Diagnosis Date  . Hyperlipidemia   . Myocardial infarction, anterior wall, subsequent care   . Bradycardia     intolerant of bradycardia  . Ventricular dysfunction     left (mild); EF is 40% per cath 4/12  . Coronary artery disease   . History of acute anterior wall MI 4/12    BMS to the LAD  . S/P coronary artery stent placement 4/12    BMS to the mid LCX    Past Surgical History  Procedure Date  . Coronary stent placement 02/17/2011    BMS to the LAD  . Vasectomy   . Coronary stent placement 03/15/11    BMS to the mid LCX    History  Smoking status  . Never Smoker   Smokeless tobacco  . Never Used    History  Alcohol Use No    Family History  Problem Relation Age of Onset  . Anemia  Mother   . Cancer Mother     liver / lung  . Anxiety disorder Mother   . Anxiety disorder Father   . Heart attack Father   . Heart failure Father   . Hyperlipidemia Mother     Review of Systems: The review of systems is positive for aching in his muscles. He will have some occasional flutters in his chest. No syncope. He is short of breath if he really exerts himself. He is trying to watch the heat.  All other systems were reviewed and are negative.  Physical Exam: BP 106/70  Pulse 68  Ht 5\' 10"  (1.778 m)  Wt 190 lb (86.183 kg)  BMI 27.26 kg/m2 Patient is very pleasant and in no acute distress. Skin is warm and dry. Color is normal.  HEENT is unremarkable. Normocephalic/atraumatic. PERRL. Sclera are nonicteric. Neck is supple. No masses. No JVD. Lungs are clear. Cardiac exam shows a regular rate and rhythm. No S3 noted. Abdomen is soft. Extremities are without edema. Gait and ROM are intact. No gross neurologic deficits noted.  LABORATORY DATA:   Assessment / Plan:

## 2011-04-05 NOTE — Assessment & Plan Note (Signed)
He is not able to tolerate beta blockers. I don't think his blood pressure will support further titration of his ARB. We will check a BMET and BNP today. He was reminded of the importance of weighing each day.

## 2011-04-05 NOTE — Patient Instructions (Signed)
Lets try you on just half dose of the Crestor We are going to check your labs today. We need to see you in about one month. Will consider an ultrasound when you are about 3 months out from your heart attack.

## 2011-04-05 NOTE — Assessment & Plan Note (Signed)
I have cut his Crestor back to just 20 mg. We will check CMET and lipids today.

## 2011-04-08 ENCOUNTER — Encounter (HOSPITAL_COMMUNITY): Payer: 59

## 2011-04-08 ENCOUNTER — Telehealth: Payer: Self-pay | Admitting: Cardiology

## 2011-04-08 ENCOUNTER — Telehealth: Payer: Self-pay | Admitting: *Deleted

## 2011-04-08 NOTE — Telephone Encounter (Signed)
Message copied by Murrell Redden on Mon Apr 08, 2011 11:54 AM ------      Message from: Swazil[place], PETER      Created: Fri Apr 05, 2011  4:33 PM       Lipids look great. LFTs are normal. Please report.

## 2011-04-08 NOTE — Telephone Encounter (Signed)
Notified of lab results. 

## 2011-04-08 NOTE — Telephone Encounter (Signed)
Wanted to know when can return to work. Advised will check w/Dr. Swaziland tomorrow and call him back.

## 2011-04-08 NOTE — Telephone Encounter (Signed)
Pt wanted to know when he can return to work please call

## 2011-04-08 NOTE — Telephone Encounter (Signed)
Message copied by Murrell Redden on Mon Apr 08, 2011 11:59 AM ------      Message from: [place], PETER      Created: Fri Apr 05, 2011  4:33 PM       Lipids look great. LFTs are normal. Please report.

## 2011-04-09 NOTE — Telephone Encounter (Signed)
No restrictions for RTW.

## 2011-04-09 NOTE — Telephone Encounter (Signed)
Per Dr. Swaziland may return to work. Will give him note.

## 2011-04-10 ENCOUNTER — Encounter (HOSPITAL_COMMUNITY): Payer: 59

## 2011-04-11 ENCOUNTER — Telehealth: Payer: Self-pay | Admitting: Cardiology

## 2011-04-11 NOTE — Telephone Encounter (Signed)
PATIENT SAID HIS "BACK TO WORK" NOTE NEEDS TO BE MORE SPECIFIC PER HIS EMPLOYER.

## 2011-04-12 ENCOUNTER — Encounter (HOSPITAL_COMMUNITY): Payer: 59

## 2011-04-12 ENCOUNTER — Encounter: Payer: Self-pay | Admitting: Cardiology

## 2011-04-12 NOTE — Telephone Encounter (Signed)
States his employer wants RTW slip to say "no heat or weigh restrictions: will fax to [employer] [phone]

## 2011-04-15 ENCOUNTER — Encounter (HOSPITAL_COMMUNITY): Payer: 59

## 2011-04-17 ENCOUNTER — Encounter (HOSPITAL_COMMUNITY): Payer: 59

## 2011-04-19 ENCOUNTER — Encounter (HOSPITAL_COMMUNITY): Payer: 59 | Attending: Cardiology

## 2011-04-19 DIAGNOSIS — Z8249 Family history of ischemic heart disease and other diseases of the circulatory system: Secondary | ICD-10-CM | POA: Insufficient documentation

## 2011-04-19 DIAGNOSIS — I2109 ST elevation (STEMI) myocardial infarction involving other coronary artery of anterior wall: Secondary | ICD-10-CM | POA: Insufficient documentation

## 2011-04-19 DIAGNOSIS — E785 Hyperlipidemia, unspecified: Secondary | ICD-10-CM | POA: Insufficient documentation

## 2011-04-19 DIAGNOSIS — Z5189 Encounter for other specified aftercare: Secondary | ICD-10-CM | POA: Insufficient documentation

## 2011-04-19 DIAGNOSIS — I517 Cardiomegaly: Secondary | ICD-10-CM | POA: Insufficient documentation

## 2011-04-19 DIAGNOSIS — I2510 Atherosclerotic heart disease of native coronary artery without angina pectoris: Secondary | ICD-10-CM | POA: Insufficient documentation

## 2011-04-19 DIAGNOSIS — Z9861 Coronary angioplasty status: Secondary | ICD-10-CM | POA: Insufficient documentation

## 2011-04-19 DIAGNOSIS — I498 Other specified cardiac arrhythmias: Secondary | ICD-10-CM | POA: Insufficient documentation

## 2011-04-22 ENCOUNTER — Encounter (HOSPITAL_COMMUNITY): Payer: 59

## 2011-04-24 ENCOUNTER — Encounter (HOSPITAL_COMMUNITY): Payer: 59

## 2011-04-26 ENCOUNTER — Encounter (HOSPITAL_COMMUNITY): Payer: 59

## 2011-04-29 ENCOUNTER — Encounter (HOSPITAL_COMMUNITY): Payer: 59

## 2011-05-01 ENCOUNTER — Encounter (HOSPITAL_COMMUNITY): Payer: 59

## 2011-05-03 ENCOUNTER — Encounter (HOSPITAL_COMMUNITY): Payer: 59

## 2011-05-06 ENCOUNTER — Ambulatory Visit (INDEPENDENT_AMBULATORY_CARE_PROVIDER_SITE_OTHER): Payer: 59 | Admitting: Nurse Practitioner

## 2011-05-06 ENCOUNTER — Encounter: Payer: Self-pay | Admitting: Nurse Practitioner

## 2011-05-06 ENCOUNTER — Encounter (HOSPITAL_COMMUNITY): Payer: 59

## 2011-05-06 VITALS — BP 118/80 | HR 62 | Wt 192.0 lb

## 2011-05-06 DIAGNOSIS — I2510 Atherosclerotic heart disease of native coronary artery without angina pectoris: Secondary | ICD-10-CM

## 2011-05-06 DIAGNOSIS — I5020 Unspecified systolic (congestive) heart failure: Secondary | ICD-10-CM

## 2011-05-06 DIAGNOSIS — I252 Old myocardial infarction: Secondary | ICD-10-CM

## 2011-05-06 NOTE — Assessment & Plan Note (Signed)
Will repeat his echo after he is 3 months out from his MI. He is doing well. Blood pressure is limiting up titration of any of his medicines. He is willing to stay on the low dose of ARB.

## 2011-05-06 NOTE — Progress Notes (Signed)
Patrick Berg Date of Birth: 04-18-1956   History of Present Illness: Patrick Berg is seen today for a one month visit. He is seen for Patrick Berg. Overall, he has done ok for the past month. No chest pain. He is not short of breath. He will still have some intermittent dizziness that he describes as being in a "fog" but it seems to be better. His myalgias are better with reduction of his statin. He is almost 3 months out from his anterior MI and we will need to recheck his echo. He is on very low dose ARB therapy. He is committed to his Effient until next April. His lipids were checked at his last visit and were excellent.  Current Outpatient Prescriptions on File Prior to Visit  Medication Sig Dispense Refill  . acetaminophen (TYLENOL) 500 MG tablet Take 1,000 mg by mouth every 6 (six) hours as needed. 2 tablets       . aspirin 81 MG EC tablet Take 81 mg by mouth daily.        Marland Kitchen co-enzyme Q-10 30 MG capsule Take 100 mg by mouth 3 (three) times daily.        . fish oil-omega-3 fatty acids 1000 MG capsule Take 1 g by mouth daily.        Marland Kitchen losartan (COZAAR) 25 MG tablet 1/2 tablet daily  45 tablet  3  . multivitamin (THERAGRAN) per tablet Take 1 tablet by mouth daily.        . nitroGLYCERIN (NITROSTAT) 0.4 MG SL tablet Place 0.4 mg under the tongue every 5 (five) minutes as needed.        . prasugrel (EFFIENT) 10 MG TABS Take 1 tablet (10 mg total) by mouth daily.  90 tablet  3  . rosuvastatin (CRESTOR) 20 MG tablet Take 1 tablet (20 mg total) by mouth at bedtime.  90 tablet  3    Allergies  Allergen Reactions  . Ace Inhibitors     cough  . Naproxen Nausea And Vomiting    Past Medical History  Diagnosis Date  . Hyperlipidemia   . Myocardial infarction, anterior wall, subsequent care   . Bradycardia     intolerant of bradycardia  . Ventricular dysfunction     left (mild); EF is 40% per cath 4/12  . Coronary artery disease   . History of acute anterior wall MI 4/12    BMS to the  LAD  . S/P coronary artery stent placement 4/12    BMS to the mid LCX    Past Surgical History  Procedure Date  . Coronary stent placement 02/17/2011    BMS to the LAD  . Vasectomy   . Coronary stent placement 03/15/11    BMS to the mid LCX    History  Smoking status  . Never Smoker   Smokeless tobacco  . Never Used    History  Alcohol Use No    Family History  Problem Relation Age of Onset  . Anemia Mother   . Cancer Mother     liver / lung  . Anxiety disorder Mother   . Anxiety disorder Father   . Heart attack Father   . Heart failure Father   . Hyperlipidemia Mother     Review of Systems: The review of systems is as above.  All other systems were reviewed and are negative.  Physical Exam: BP 118/80  Pulse 62  Wt 192 lb (87.091 kg) Patient is very pleasant and  in no acute distress. Skin is warm and dry. Color is normal.  HEENT is unremarkable. Normocephalic/atraumatic. PERRL. Sclera are nonicteric. Neck is supple. No masses. No JVD. Lungs are clear. Cardiac exam shows a regular rate and rhythm. Abdomen is soft. Extremities are without edema. Gait and ROM are intact. No gross neurologic deficits noted.  LABORATORY DATA:   Assessment / Plan:

## 2011-05-06 NOTE — Patient Instructions (Signed)
We are going to arrange for an ultrasound of your heart after the 1st of July We will see you back in about 2 months Call for any problems in the interim.

## 2011-05-06 NOTE — Assessment & Plan Note (Signed)
He is doing well. We will keep him on his current regimen. Echo will be ordered for after the 1st of July to reassess his EF.

## 2011-05-08 ENCOUNTER — Encounter (HOSPITAL_COMMUNITY): Payer: 59

## 2011-05-08 NOTE — Discharge Summary (Signed)
NAME:  PARIS, CHIRIBOGA NO.:  [id number]  MEDICAL RECORD NO.:  [id number]           PATIENT TYPE:  O  LOCATION:  6524                         FACILITY:  [facility]  PHYSICIAN:  Rollene Rotunda, MD, FACCDATE OF BIRTH:  1956-03-03  DATE OF ADMISSION:  03/15/2011 DATE OF DISCHARGE:  03/16/2011                              DISCHARGE SUMMARY   PRIMARY CARDIOLOGIST:  Peter M. Swaziland, MD  DISCHARGE DIAGNOSIS:  Coronary artery disease.  SECONDARY DIAGNOSES: 1. Recent anterior myocardial infarction status post left anterior     descending coronary artery bare-metal stenting. 2. Ischemic cardiomyopathy, EF of 40%. 3. Hyperlipidemia. 4. History of hypotension. 5. Bradycardia with discontinuation of beta-blocker this admission. 6. Status post vasectomy.  ALLERGIES:  NAPROXEN.  PROCEDURES:  Left heart cardiac catheterization with successful planned PCI and stenting of the mid-left circumflex and placement of a 2.75 x 20 mm VeriFLEX bare-metal stent.  HISTORY OF PRESENT ILLNESS:  A 55 year old male who was recently hospitalized from February 17, 2011, through February 19, 2011, following acute anterior ST-elevation MI, during which time, he underwent stenting of his proximal LAD with bare-metal stent.  The patient had residual 70-80% stenosis in mid left circumflex with plan to intervene upon this percutaneously in a staged fashion.  The patient's EF at the time of discharge was 40%.  The patient was followed up in the office with Dr. Swaziland on March 07, 2011, and did report fatigue as well as minimal dyspnea.  Arrangements were made for elective PCI of the circumflex.  HOSPITAL COURSE:  The patient was sent to the [facility] on March 15, 2011, and underwent catheterization and successful PCI and stenting of the mid-left circumflex with placement of 2.75 x 20 mm VeriFLEX bare-metal stent.  The patient tolerated the procedure well and postprocedure, has been maintained on  aspirin and Effient.  The patient was noted overnight to have significant asymptomatic bradycardia and for that reason, his beta-blocker has been discontinued.  Further, the patient reported cough ever since initiating lisinopril therapy and we have therefore discontinued this and switched him to low-dose losartan. The patient will be discharged home today in good condition.  DISCHARGE LABS:  Hemoglobin 13.7, hematocrit 40.1, WBC 7.1, platelets 180.  Sodium 135, potassium 4.2, chloride 105, CO2 26, BUN 13, creatinine 0.70, glucose 90, calcium 8.6.  DISPOSITION:  The patient will be discharged home today in good condition.  FOLLOWUP PLANS AND APPOINTMENTS:  We will arrange followup with Dr. Peter Swaziland in approximately 2 weeks.  DISCHARGE MEDICATIONS: 1. Cozaar 25 mg 1/2 tablet daily. 2. Aspirin 81 mg daily. 3. Nitroglycerin 0.4 mg p.r.n. chest pain. 4. Coenzyme Q10 1 tab daily. 5. Fish oil 1 cap daily. 6. Multivitamin daily. 7. Prasugrel 10 mg daily. 8. Rosuvastatin 40 mg at bedtime. 9. Tylenol Extra Strength 500 mg 2 tabs q.6 hours p.r.n.  OUTSTANDING LAB STUDIES:  None.  DURATION OF DISCHARGE ENCOUNTER:  35 minutes including physician time.     Nicolasa Ducking, ANP   ______________________________ Rollene Rotunda, MD, [facility]    CB/MEDQ  D:  03/16/2011  T:  03/16/2011  Job:  [id number]  Electronically Signed by Nicolasa Ducking ANP on 03/21/2011 04:27:39 PM Electronically Signed by Rollene Rotunda MD [facility] on 05/08/2011 11:25:36 AM

## 2011-05-10 ENCOUNTER — Encounter (HOSPITAL_COMMUNITY): Payer: 59

## 2011-05-13 ENCOUNTER — Encounter (HOSPITAL_COMMUNITY): Payer: 59

## 2011-05-15 ENCOUNTER — Encounter (HOSPITAL_COMMUNITY): Payer: 59

## 2011-05-17 ENCOUNTER — Encounter (HOSPITAL_COMMUNITY): Payer: 59

## 2011-05-20 ENCOUNTER — Ambulatory Visit (HOSPITAL_COMMUNITY): Payer: 59 | Attending: Cardiology

## 2011-05-20 ENCOUNTER — Encounter (HOSPITAL_COMMUNITY): Payer: 59 | Attending: Cardiology

## 2011-05-20 DIAGNOSIS — I2109 ST elevation (STEMI) myocardial infarction involving other coronary artery of anterior wall: Secondary | ICD-10-CM | POA: Insufficient documentation

## 2011-05-20 DIAGNOSIS — I079 Rheumatic tricuspid valve disease, unspecified: Secondary | ICD-10-CM | POA: Insufficient documentation

## 2011-05-20 DIAGNOSIS — E785 Hyperlipidemia, unspecified: Secondary | ICD-10-CM | POA: Insufficient documentation

## 2011-05-20 DIAGNOSIS — I2510 Atherosclerotic heart disease of native coronary artery without angina pectoris: Secondary | ICD-10-CM | POA: Insufficient documentation

## 2011-05-20 DIAGNOSIS — I252 Old myocardial infarction: Secondary | ICD-10-CM

## 2011-05-20 DIAGNOSIS — Z5189 Encounter for other specified aftercare: Secondary | ICD-10-CM | POA: Insufficient documentation

## 2011-05-20 DIAGNOSIS — I379 Nonrheumatic pulmonary valve disorder, unspecified: Secondary | ICD-10-CM | POA: Insufficient documentation

## 2011-05-20 DIAGNOSIS — Z8249 Family history of ischemic heart disease and other diseases of the circulatory system: Secondary | ICD-10-CM | POA: Insufficient documentation

## 2011-05-20 DIAGNOSIS — I059 Rheumatic mitral valve disease, unspecified: Secondary | ICD-10-CM | POA: Insufficient documentation

## 2011-05-20 DIAGNOSIS — Z9861 Coronary angioplasty status: Secondary | ICD-10-CM | POA: Insufficient documentation

## 2011-05-20 DIAGNOSIS — I517 Cardiomegaly: Secondary | ICD-10-CM | POA: Insufficient documentation

## 2011-05-20 DIAGNOSIS — I498 Other specified cardiac arrhythmias: Secondary | ICD-10-CM | POA: Insufficient documentation

## 2011-05-22 ENCOUNTER — Encounter (HOSPITAL_COMMUNITY): Payer: 59

## 2011-05-23 ENCOUNTER — Telehealth: Payer: Self-pay | Admitting: *Deleted

## 2011-05-23 NOTE — Telephone Encounter (Signed)
Message copied by Lorayne Bender on Thu May 23, 2011  4:20 PM ------      Message from: Swaziland, PETER M      Created: Tue May 21, 2011  3:56 PM       Mid septal hypokinesis. Overall LV function much better.

## 2011-05-23 NOTE — Telephone Encounter (Signed)
Lm w/ echo results. Advised to call back if has questions. 

## 2011-05-24 ENCOUNTER — Encounter (HOSPITAL_COMMUNITY): Payer: 59

## 2011-05-27 ENCOUNTER — Encounter (HOSPITAL_COMMUNITY): Payer: 59

## 2011-05-29 ENCOUNTER — Encounter (HOSPITAL_COMMUNITY): Payer: 59

## 2011-05-31 ENCOUNTER — Encounter (HOSPITAL_COMMUNITY): Payer: 59

## 2011-06-03 ENCOUNTER — Encounter (HOSPITAL_COMMUNITY): Payer: 59

## 2011-06-05 ENCOUNTER — Encounter (HOSPITAL_COMMUNITY): Payer: 59

## 2011-06-07 ENCOUNTER — Encounter (HOSPITAL_COMMUNITY): Payer: 59

## 2011-06-10 ENCOUNTER — Encounter (HOSPITAL_COMMUNITY): Payer: 59

## 2011-06-12 ENCOUNTER — Encounter (HOSPITAL_COMMUNITY): Payer: 59

## 2011-06-14 ENCOUNTER — Encounter (HOSPITAL_COMMUNITY): Payer: 59

## 2011-06-17 ENCOUNTER — Encounter (HOSPITAL_COMMUNITY): Payer: 59

## 2011-06-19 ENCOUNTER — Encounter (HOSPITAL_COMMUNITY): Payer: 59 | Attending: Cardiology

## 2011-06-19 DIAGNOSIS — Z5189 Encounter for other specified aftercare: Secondary | ICD-10-CM | POA: Insufficient documentation

## 2011-06-19 DIAGNOSIS — I517 Cardiomegaly: Secondary | ICD-10-CM | POA: Insufficient documentation

## 2011-06-19 DIAGNOSIS — Z8249 Family history of ischemic heart disease and other diseases of the circulatory system: Secondary | ICD-10-CM | POA: Insufficient documentation

## 2011-06-19 DIAGNOSIS — I498 Other specified cardiac arrhythmias: Secondary | ICD-10-CM | POA: Insufficient documentation

## 2011-06-19 DIAGNOSIS — E785 Hyperlipidemia, unspecified: Secondary | ICD-10-CM | POA: Insufficient documentation

## 2011-06-19 DIAGNOSIS — Z9861 Coronary angioplasty status: Secondary | ICD-10-CM | POA: Insufficient documentation

## 2011-06-19 DIAGNOSIS — I2109 ST elevation (STEMI) myocardial infarction involving other coronary artery of anterior wall: Secondary | ICD-10-CM | POA: Insufficient documentation

## 2011-06-19 DIAGNOSIS — I2510 Atherosclerotic heart disease of native coronary artery without angina pectoris: Secondary | ICD-10-CM | POA: Insufficient documentation

## 2011-06-21 ENCOUNTER — Encounter (HOSPITAL_COMMUNITY): Payer: 59

## 2011-06-24 ENCOUNTER — Ambulatory Visit (HOSPITAL_COMMUNITY): Payer: 59

## 2011-06-26 ENCOUNTER — Ambulatory Visit (HOSPITAL_COMMUNITY): Payer: 59

## 2011-06-28 ENCOUNTER — Ambulatory Visit (HOSPITAL_COMMUNITY): Payer: 59

## 2011-07-02 ENCOUNTER — Telehealth: Payer: Self-pay | Admitting: Cardiology

## 2011-07-02 NOTE — Telephone Encounter (Signed)
Pt reports upcoming procedure of 3 teeth being extracted. Wanted to get clearance from Dr Swaziland about pre-op antibiotic. Procedure is elective and going to be done on Saturday 07/06/2011. Dentist is Dr Epifanio Lesches office phone number [phone]. Pt is aware that Dr Swaziland is out of the office this week and will not be back til 8/20. Verbalizes understanding.

## 2011-07-02 NOTE — Telephone Encounter (Signed)
Pt needs needs in writing clearance for oral surgery on this Saturday, please call pt back when he can stop by to pick

## 2011-07-03 ENCOUNTER — Telehealth: Payer: Self-pay | Admitting: *Deleted

## 2011-07-03 NOTE — Telephone Encounter (Signed)
States he needs note from [place] stating he is cleared from cardiac standpoint for extraction of 3 teeth on Saturday. Per Dr. Patty Sermons does not need SBE and can be cleared for extractions.(Dr. Swaziland on vac). Faxed note to Dr. Regino Schultze [phone] stating that he is cleared from cardiac standpoint for extractions, signed by Dr.Brackbill

## 2011-07-10 ENCOUNTER — Encounter: Payer: Self-pay | Admitting: Cardiology

## 2011-07-10 ENCOUNTER — Ambulatory Visit (INDEPENDENT_AMBULATORY_CARE_PROVIDER_SITE_OTHER): Payer: 59 | Admitting: Cardiology

## 2011-07-10 VITALS — BP 118/64 | HR 78 | Ht 70.0 in | Wt 196.0 lb

## 2011-07-10 DIAGNOSIS — E785 Hyperlipidemia, unspecified: Secondary | ICD-10-CM

## 2011-07-10 DIAGNOSIS — I2510 Atherosclerotic heart disease of native coronary artery without angina pectoris: Secondary | ICD-10-CM

## 2011-07-10 DIAGNOSIS — I2109 ST elevation (STEMI) myocardial infarction involving other coronary artery of anterior wall: Secondary | ICD-10-CM

## 2011-07-10 DIAGNOSIS — I519 Heart disease, unspecified: Secondary | ICD-10-CM

## 2011-07-10 NOTE — Assessment & Plan Note (Signed)
He will continue on dual anti- platelet therapy. If he is unable to afford Effient we will switch him to Plavix. At one year we can discontinue Effient and use aspirin alone since he had bare metal stents.

## 2011-07-10 NOTE — Assessment & Plan Note (Signed)
He has had no recurrent anginal symptoms. He has not been on a beta blocker due to his resting bradycardia. I recommended continuing on losartan for at least a year. His echocardiogram did show significant improvement in his ejection fraction.

## 2011-07-10 NOTE — Assessment & Plan Note (Signed)
Given his significant arthralgias I recommend a reduction in his Crestor dose to 10 mg per day. His last lipid panel looked excellent. Will repeat fasting lab work on his next visit in 3 months.

## 2011-07-10 NOTE — Patient Instructions (Signed)
Reduce your Crestor to 10 mg daily.  Continue your other medications.  Continue exercise.  I will see you again in 3 months with fasting lab work.

## 2011-07-10 NOTE — Assessment & Plan Note (Signed)
We'll continue with low-dose losartan. He has no evidence of volume overload. His ejection fraction had improved by echocardiogram.

## 2011-07-10 NOTE — Progress Notes (Signed)
Patrick Berg Date of Birth: [birth year]   History of Present Illness: Patrick Berg is seen today for followup. He states he has been doing very well from a cardiac standpoint. He denies any recurrent chest pain or shortness of breath. He has been exercising regularly and has completed cardiac rehabilitation. He did undergo recent oral surgery with extraction of 4 teeth. He did note to increased bleeding with that but it did resolve. He also complains of increased knee pain which he attributes to his statin therapy. This did improve with a reduction in his toes but he has noticed more pain in his right knee recently. He is concerned about his insurance status since he was previously on his wife's planned but she apparently has lost her job. He is seeking employment currently.  Current Outpatient Prescriptions on File Prior to Visit  Medication Sig Dispense Refill  . acetaminophen (TYLENOL) 500 MG tablet Take 1,000 mg by mouth every 6 (six) hours as needed. 2 tablets       . aspirin 81 MG EC tablet Take 81 mg by mouth daily.        Marland Kitchen co-enzyme Q-10 30 MG capsule Take 100 mg by mouth daily.       . fish oil-omega-3 fatty acids 1000 MG capsule Take 1 g by mouth daily.        Marland Kitchen losartan (COZAAR) 25 MG tablet 1/2 tablet daily  45 tablet  3  . multivitamin (THERAGRAN) per tablet Take 1 tablet by mouth daily.        . nitroGLYCERIN (NITROSTAT) 0.4 MG SL tablet Place 0.4 mg under the tongue every 5 (five) minutes as needed.        . prasugrel (EFFIENT) 10 MG TABS Take 1 tablet (10 mg total) by mouth daily.  90 tablet  3  . rosuvastatin (CRESTOR) 20 MG tablet Take 1 tablet (20 mg total) by mouth at bedtime.  90 tablet  3    Allergies  Allergen Reactions  . Ace Inhibitors     cough  . Naproxen Nausea And Vomiting    Past Medical History  Diagnosis Date  . Hyperlipidemia   . Myocardial infarction, anterior wall, subsequent care   . Bradycardia     intolerant of bradycardia  . Ventricular  dysfunction     left (mild); EF is 40% per cath 4/12  . Coronary artery disease   . History of acute anterior wall MI 4/12    BMS to the LAD  . S/P coronary artery stent placement 4/12    BMS to the mid LCX    Past Surgical History  Procedure Date  . Coronary stent placement 02/17/2011    BMS to the LAD  . Vasectomy   . Coronary stent placement 03/15/11    BMS to the mid LCX    History  Smoking status  . Never Smoker   Smokeless tobacco  . Never Used    History  Alcohol Use No    Family History  Problem Relation Age of Onset  . Anemia Mother   . Cancer Mother     liver / lung  . Anxiety disorder Mother   . Anxiety disorder Father   . Heart attack Father   . Heart failure Father   . Hyperlipidemia Mother     Review of Systems: The review of systems is as above.  All other systems were reviewed and are negative.  Physical Exam: BP 118/64  Pulse 78  Ht  5\' 10"  (1.778 m)  Wt 196 lb (88.905 kg)  BMI 28.12 kg/m2 Patient is very pleasant and in no acute distress. Skin is warm and dry. Color is normal.  HEENT is unremarkable. Normocephalic/atraumatic. PERRL. Sclera are nonicteric. Neck is supple. No masses. No JVD. Lungs are clear. Cardiac exam shows a regular rate and rhythm. Abdomen is soft. Extremities are without edema. Gait and ROM are intact. No gross neurologic deficits noted.  LABORATORY DATA:   Assessment / Plan:

## 2011-09-24 ENCOUNTER — Ambulatory Visit: Payer: 59 | Admitting: Cardiology

## 2011-09-24 ENCOUNTER — Other Ambulatory Visit: Payer: 59 | Admitting: *Deleted

## 2011-10-14 IMAGING — CR DG CHEST 1V PORT
1 series · 1 of 1 positions shown · non-contrast
Comparison: None.

CLINICAL DATA: MI

PORTABLE CHEST - 1 VIEW

[AP]
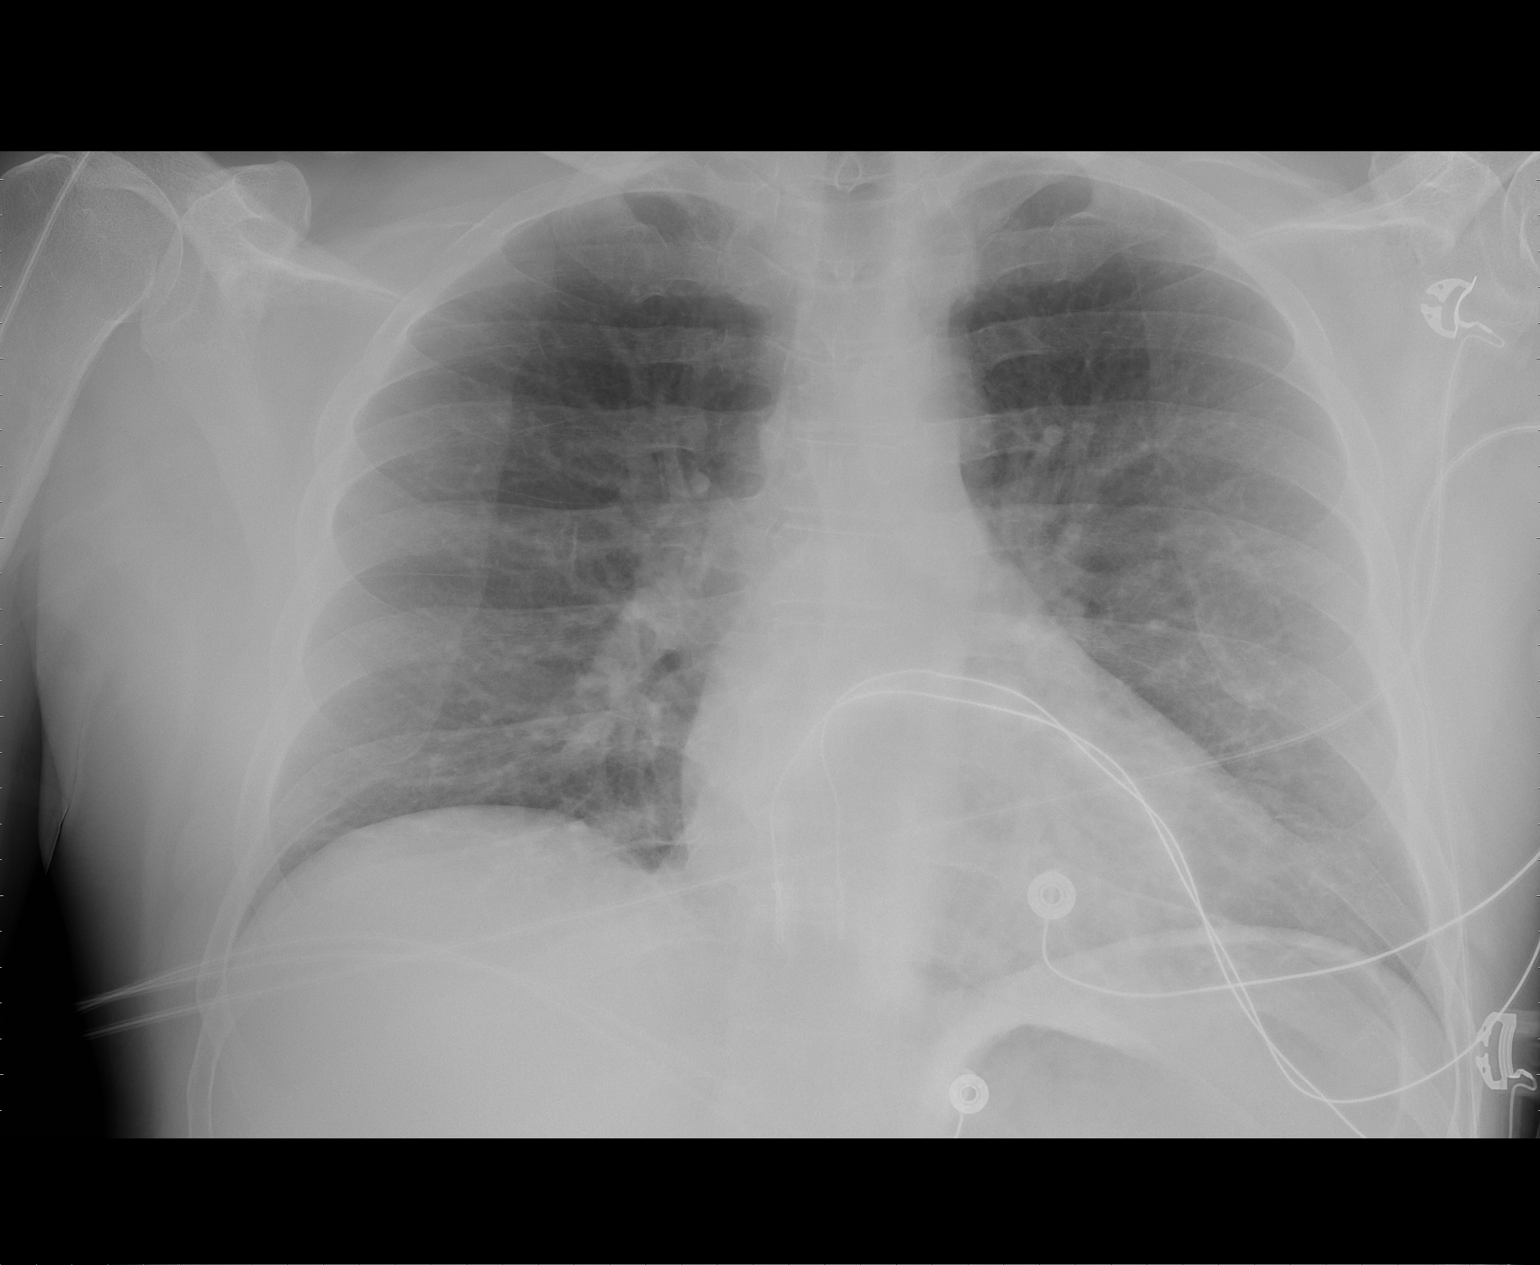

[1 of 1 positions shown; findings below may reference images not displayed]

FINDINGS: The lungs are low volume but clear bilaterally.  No
confluent airspace opacities, pleural effuions or pneumothoracies
are seen.  The heart is normal in size and contour.  The upper
abdomen and osseous structures are normal.
IMPRESSION: Low lung volumes without acute cardiopulmonary disease.

## 2012-01-21 ENCOUNTER — Encounter: Payer: Self-pay | Admitting: Nurse Practitioner

## 2012-01-21 ENCOUNTER — Other Ambulatory Visit (INDEPENDENT_AMBULATORY_CARE_PROVIDER_SITE_OTHER): Payer: BC Managed Care – PPO

## 2012-01-21 ENCOUNTER — Ambulatory Visit (INDEPENDENT_AMBULATORY_CARE_PROVIDER_SITE_OTHER): Payer: Self-pay | Admitting: Nurse Practitioner

## 2012-01-21 VITALS — BP 122/82 | HR 58 | Ht 71.0 in | Wt 201.0 lb

## 2012-01-21 DIAGNOSIS — I5020 Unspecified systolic (congestive) heart failure: Secondary | ICD-10-CM

## 2012-01-21 DIAGNOSIS — E785 Hyperlipidemia, unspecified: Secondary | ICD-10-CM

## 2012-01-21 DIAGNOSIS — I2510 Atherosclerotic heart disease of native coronary artery without angina pectoris: Secondary | ICD-10-CM

## 2012-01-21 LAB — HEPATIC FUNCTION PANEL
ALT: 23 U/L (ref 0–53)
AST: 25 U/L (ref 0–37)
Albumin: 4.2 g/dL (ref 3.5–5.2)
Total Protein: 7 g/dL (ref 6.0–8.3)

## 2012-01-21 LAB — BASIC METABOLIC PANEL
BUN: 22 mg/dL (ref 6–23)
GFR: 128.27 mL/min (ref >60.00)
Glucose, Bld: 89 mg/dL (ref 70–99)
Potassium: 4.1 mEq/L (ref 3.5–5.1)

## 2012-01-21 LAB — LIPID PANEL
Cholesterol: 160 mg/dL (ref 0–200)
VLDL: 22.2 mg/dL (ref 0.0–40.0)

## 2012-01-21 NOTE — Assessment & Plan Note (Signed)
No symptoms. Doing well 

## 2012-01-21 NOTE — Progress Notes (Signed)
Patrick Berg Date of Birth: 02-25-1956 Medical Record #[id number]  History of Present Illness: [name] is seen today for a follow up visit. He is seen for Dr. Swaziland. He is now almost one year out from his MI/BMS to the LAD and BMS to the LCX. Follow up echo showed normalization of his EF in July.   He comes in today. He is doing well. Tolerating his medicines but still has some arthralgias. Feels like it is related to Crestor. Has about 2 more weeks of his Effient. Does have some bruising. 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 He does not really exercise routinely but does a lot of walking with his work. Does note very rare chest discomfort if he is really overdoing things. He feels like he is doing well.   Current Outpatient Prescriptions on File Prior to Visit  Medication Sig Dispense Refill  . acetaminophen (TYLENOL) 500 MG tablet Take 1,000 mg by mouth every 6 (six) hours as needed. 2 tablets       . aspirin 81 MG EC tablet Take 81 mg by mouth daily.        Marland Kitchen co-enzyme Q-10 30 MG capsule Take 100 mg by mouth daily.       . fish oil-omega-3 fatty acids 1000 MG capsule Take 1 g by mouth daily.        . Flaxseed, Linseed, (FLAX SEED OIL PO) Take by mouth daily.        Marland Kitchen losartan (COZAAR) 25 MG tablet 1/2 tablet daily  45 tablet  3  . multivitamin (THERAGRAN) per tablet Take 1 tablet by mouth daily.        . nitroGLYCERIN (NITROSTAT) 0.4 MG SL tablet Place 0.4 mg under the tongue every 5 (five) minutes as needed.        . prasugrel (EFFIENT) 10 MG TABS Take 1 tablet (10 mg total) by mouth daily.  90 tablet  3  . DISCONTD: rosuvastatin (CRESTOR) 20 MG tablet Take 1 tablet (20 mg total) by mouth at bedtime.  90 tablet  3    Allergies  Allergen Reactions  . Ace Inhibitors     cough  . Naproxen Nausea And Vomiting    Past Medical History  Diagnosis Date  . Hyperlipidemia   . Myocardial infarction, anterior wall, subsequent care   . Bradycardia     intolerant of bradycardia  .  Ventricular dysfunction     left (mild); EF is 40% per cath 4/12; improved to 55% per echo in July 2012  . Coronary artery disease   . History of acute anterior wall MI 4/12    BMS to the LAD  . S/P coronary artery stent placement 4/12    BMS to the mid LCX  . Arthralgia     Past Surgical History  Procedure Date  . Coronary stent placement 02/17/2011    BMS to the LAD  . Vasectomy   . Coronary stent placement 03/15/11    BMS to the mid LCX    History  Smoking status  . Never Smoker   Smokeless tobacco  . Never Used    History  Alcohol Use No    Family History  Problem Relation Age of Onset  . Anemia Mother   . Cancer Mother     liver / lung  . Anxiety disorder Mother   . Anxiety disorder Father   . Heart attack Father   . Heart failure Father   . Hyperlipidemia Mother  Review of Systems: The review of systems is per the HPI.  All other systems were reviewed and are negative.  Physical Exam: BP 122/82  Pulse 58  Ht 5\' 11"  (1.803 m)  Wt 201 lb (91.173 kg)  BMI 28.03 kg/m2 Patient is very pleasant and in no acute distress. Skin is warm and dry. Color is normal.  HEENT is unremarkable. Normocephalic/atraumatic. PERRL. Sclera are nonicteric. Neck is supple. No masses. No JVD. Lungs are clear. Cardiac exam shows a regular rate and rhythm. Abdomen is soft. Extremities are without edema. Gait and ROM are intact. No gross neurologic deficits noted.   LABORATORY DATA: PENDING   Assessment / Plan:

## 2012-01-21 NOTE — Assessment & Plan Note (Signed)
He is doing well. EF has normalized per echo in July 2012. Will let him finish his current supply of Effient and then stop. We will be checking fasting labs today. We will tentatively see him back in 6 months. He is to let us know if he has any change in symptoms. Patient is agreeable to this plan and will call if any problems develop in the interim.

## 2012-01-21 NOTE — Patient Instructions (Signed)
We are going to check your labs today.  We will consider cutting back on the Crestor even more based on those results.  You may stop your Effient when your current supply. Stay on the aspirin.  We will see you back in 6 months.  Call the [facility] office at [phone] if you have any questions, problems or concerns.

## 2012-01-21 NOTE — Assessment & Plan Note (Signed)
We are checking fasting labs today. May need to cut the Crestor back further or give him a trial of another agent.

## 2012-01-28 ENCOUNTER — Other Ambulatory Visit: Payer: Self-pay

## 2012-01-28 ENCOUNTER — Telehealth: Payer: Self-pay | Admitting: Cardiology

## 2012-01-28 DIAGNOSIS — E785 Hyperlipidemia, unspecified: Secondary | ICD-10-CM

## 2012-01-28 MED ORDER — EZETIMIBE 10 MG PO TABS: 10.0000 mg | ORAL_TABLET | Freq: Every day | ORAL | Status: DC

## 2012-01-28 MED ORDER — ROSUVASTATIN CALCIUM 20 MG PO TABS: 10.0000 mg | ORAL_TABLET | Freq: Every day | ORAL | Status: DC

## 2012-01-28 NOTE — Telephone Encounter (Signed)
Patient called wanting [facility].Crestor 20 mg take 1/2 daily [facility].

## 2012-01-28 NOTE — Telephone Encounter (Signed)
Fu call °Patient returning your call °

## 2012-01-30 ENCOUNTER — Other Ambulatory Visit: Payer: Self-pay

## 2012-01-30 MED ORDER — EZETIMIBE 10 MG PO TABS: 10.0000 mg | ORAL_TABLET | Freq: Every day | ORAL | Status: DC

## 2012-04-29 ENCOUNTER — Other Ambulatory Visit: Payer: BC Managed Care – PPO

## 2012-04-30 ENCOUNTER — Other Ambulatory Visit (INDEPENDENT_AMBULATORY_CARE_PROVIDER_SITE_OTHER): Payer: BC Managed Care – PPO

## 2012-04-30 DIAGNOSIS — E785 Hyperlipidemia, unspecified: Secondary | ICD-10-CM

## 2012-04-30 LAB — HEPATIC FUNCTION PANEL
ALT: 27 U/L (ref 0–53)
AST: 26 U/L (ref 0–37)
Bilirubin, Direct: 0.2 mg/dL (ref 0.0–0.3)
Total Protein: 7 g/dL (ref 6.0–8.3)

## 2012-04-30 LAB — LIPID PANEL
Cholesterol: 141 mg/dL (ref 0–200)
Total CHOL/HDL Ratio: 3
Triglycerides: 88 mg/dL (ref 0.0–149.0)

## 2012-10-02 ENCOUNTER — Telehealth: Payer: Self-pay | Admitting: Cardiology

## 2012-10-02 DIAGNOSIS — I2510 Atherosclerotic heart disease of native coronary artery without angina pectoris: Secondary | ICD-10-CM

## 2012-10-02 NOTE — Telephone Encounter (Signed)
Spoke with patient he wants to check with Dr.Jordan to see if he needs a stress test.Will check with Dr.Jordan next week and cal back.

## 2012-10-02 NOTE — Telephone Encounter (Signed)
New problem:  New guideline from DOT physical require  patient to have stress test. Please advise is one is necessary or not .

## 2012-10-06 NOTE — Telephone Encounter (Signed)
Patient called was told spoke to Dr.Jordan he advised if DOT requires a stress test he will need to have done.Advised will need a stress myoview.Patient was told schedulers will call to schedule.

## 2012-10-28 ENCOUNTER — Ambulatory Visit (HOSPITAL_COMMUNITY): Payer: BC Managed Care – PPO | Attending: Cardiovascular Disease | Admitting: Radiology

## 2012-10-28 VITALS — BP 124/85 | Ht 71.0 in | Wt 202.0 lb

## 2012-10-28 DIAGNOSIS — E785 Hyperlipidemia, unspecified: Secondary | ICD-10-CM | POA: Insufficient documentation

## 2012-10-28 DIAGNOSIS — I2510 Atherosclerotic heart disease of native coronary artery without angina pectoris: Secondary | ICD-10-CM | POA: Insufficient documentation

## 2012-10-28 DIAGNOSIS — I2109 ST elevation (STEMI) myocardial infarction involving other coronary artery of anterior wall: Secondary | ICD-10-CM

## 2012-10-28 DIAGNOSIS — Z8249 Family history of ischemic heart disease and other diseases of the circulatory system: Secondary | ICD-10-CM | POA: Insufficient documentation

## 2012-10-28 MED ORDER — TECHNETIUM TC 99M SESTAMIBI GENERIC - CARDIOLITE
10.0000 | Freq: Once | INTRAVENOUS | Status: AC | PRN
  Administered 2012-10-28: 10 via INTRAVENOUS

## 2012-10-28 MED ORDER — TECHNETIUM TC 99M SESTAMIBI GENERIC - CARDIOLITE
30.0000 | Freq: Once | INTRAVENOUS | Status: AC | PRN
  Administered 2012-10-28: 30 via INTRAVENOUS

## 2012-10-28 NOTE — Progress Notes (Signed)
[facility] SITE 3 NUCLEAR MED [id number] [place] [address] [phone]  Cardiology Nuclear Med Study  Patrick Berg is a 56 y.o. male     MRN : [id number]     DOB: Dec 11, 1955  Procedure Date: 10/28/2012  Nuclear Med Background Indication for Stress Test:  Evaluation for Ischemia, Stent Patency, and DOT Clearance History:  4/12 MI-Heart Cath: N/O CAD EF: 40% Stents LAD/CFX 7/12 ECHO: EF: 55-60%  Cardiac Risk Factors: Family History - CAD and Lipids  Symptoms:  n/a   Nuclear Pre-Procedure Caffeine/Decaff Intake:  None > 12 hrs NPO After: 6:00pm   Lungs:  clear O2 Sat: 96 % on room air. IV 0.9% NS with Angio Cath:  20g  IV Site: R Hand x 1, tolerated well IV Started by:  Irean Hong, RN  Chest Size (in):  42 Cup Size: n/a  Height: 5\' 11"  (1.803 m)  Weight:  202 lb (91.627 kg)  BMI:  Body mass index is 28.17 kg/(m^2). Tech Comments:  n/a    Nuclear Med Study 1 or 2 day study: 1 day  Stress Test Type:  Stress  Reading MD: Kristeen Miss, MD  Order Authorizing Provider:  Peter Swaziland, MD  Resting Radionuclide: Technetium 63m Sestamibi  Resting Radionuclide Dose: 11.0 mCi   Stress Radionuclide:  Technetium 73m Sestamibi  Stress Radionuclide Dose: 33.0 mCi           Stress Protocol Rest HR: 61 Stress HR: 137  Rest BP: 124/85 Stress BP: 205/77  Exercise Time (min): 11:31 METS: 13.40   Predicted Max HR: 164 bpm % Max HR: 83.54 bpm Rate Pressure Product: 62952   Dose of Adenosine (mg):  n/a Dose of Lexiscan: n/a mg  Dose of Atropine (mg): n/a Dose of Dobutamine: n/a mcg/kg/min (at max HR)  Stress Test Technologist: Smiley Houseman, CMA-N  Nuclear Technologist:  Doyne Keel, CNMT     Rest Procedure:  Myocardial perfusion imaging was performed at rest 45 minutes following the intravenous administration of Technetium 57m Sestamibi. Rest ECG: NSR - Normal EKG  Stress Procedure:  The patient performed treadmill exercise using a Bruce   Protocol for 11:31 minutes. The patient stopped due to fatigue and denied any chest pain and a rare pac.  Technetium 59m Sestamibi was injected at peak exercise and myocardial perfusion imaging was performed after a brief delay. Stress ECG: No significant change from baseline ECG  QPS Raw Data Images:  Normal; no motion artifact; normal heart/lung ratio. Stress Images:  Normal homogeneous uptake in all areas of the myocardium. Rest Images:  Normal homogeneous uptake in all areas of the myocardium. Subtraction (SDS):  No evidence of ischemia. Transient Ischemic Dilatation (Normal <1.22):  0.97 Lung/Heart Ratio (Normal <0.45):  0.33  Quantitative Gated Spect Images QGS EDV:  117 ml QGS ESV:  57 ml  Impression Exercise Capacity:  Excellent exercise capacity. BP Response:  Normal blood pressure response. Clinical Symptoms:  No significant symptoms noted. ECG Impression:  No significant ST segment change suggestive of ischemia. Comparison with Prior Nuclear Study: No images to compare  Overall Impression:  Normal stress nuclear study. No evidence of ischemia.   LV Ejection Fraction: 51%.  LV Wall Motion:  NL LV Function; NL Wall Motion.   [name], [name]., MD, [facility] 10/28/2012, 5:16 PM Office - [phone] Pager [phone]

## 2012-11-03 ENCOUNTER — Telehealth: Payer: Self-pay | Admitting: Cardiology

## 2012-11-03 NOTE — Telephone Encounter (Signed)
Pt needs results of DOT, needs to pick up a copy as well, pls call [phone]

## 2012-11-03 NOTE — Telephone Encounter (Signed)
Patient called myoview results given.Cleared for DOT.Results mailed to patient.

## 2013-04-30 ENCOUNTER — Telehealth: Payer: Self-pay | Admitting: Cardiology

## 2013-04-30 NOTE — Telephone Encounter (Signed)
New problem    Due to recent lab results. Should medication be change

## 2013-05-03 NOTE — Telephone Encounter (Signed)
Returned call to [place] at [employer] no answer.LMTC.

## 2013-05-19 ENCOUNTER — Other Ambulatory Visit: Payer: Self-pay | Admitting: Cardiology

## 2013-05-20 ENCOUNTER — Other Ambulatory Visit: Payer: Self-pay

## 2013-05-20 MED ORDER — ROSUVASTATIN CALCIUM 20 MG PO TABS: ORAL_TABLET | ORAL | Status: DC

## 2013-05-20 NOTE — Telephone Encounter (Signed)
Patient called he stated he cannot afford zetia.Stated he is taking crestor 20 mg 1/2 daily.Spoke to Dr.Jordan he advised to continue crestor 20 mg 1/2 daily.Advised to keep appointment with Dr.Jordan 07/22/13.

## 2013-05-25 ENCOUNTER — Telehealth: Payer: Self-pay

## 2013-05-25 NOTE — Telephone Encounter (Signed)
[facility] alternative to crestor medication. Atorvastatin or simvastion? Phone number [phone] fax number [phone] Case number [id number] Member number [id number]

## 2013-05-26 MED ORDER — ATORVASTATIN CALCIUM 20 MG PO TABS: 20.0000 mg | ORAL_TABLET | Freq: Every day | ORAL | Status: DC

## 2013-05-26 NOTE — Telephone Encounter (Signed)
Patient called was told received a [facility] to change crestor to atorvastatin or simvastatin.Dr.Jordan advised to stop crestor start atorvastatin 20 mg daily.

## 2013-07-22 ENCOUNTER — Ambulatory Visit (INDEPENDENT_AMBULATORY_CARE_PROVIDER_SITE_OTHER): Payer: BC Managed Care – PPO | Admitting: Cardiology

## 2013-07-22 ENCOUNTER — Encounter: Payer: Self-pay | Admitting: Cardiology

## 2013-07-22 DIAGNOSIS — I2510 Atherosclerotic heart disease of native coronary artery without angina pectoris: Secondary | ICD-10-CM

## 2013-07-22 DIAGNOSIS — I2109 ST elevation (STEMI) myocardial infarction involving other coronary artery of anterior wall: Secondary | ICD-10-CM

## 2013-07-22 DIAGNOSIS — E785 Hyperlipidemia, unspecified: Secondary | ICD-10-CM

## 2013-07-22 NOTE — Progress Notes (Signed)
Patrick Berg Date of Birth: 03/01/1956 Medical Record #[id number]  History of Present Illness: Patrick Berg is seen today for a follow up visit. He is status post  MI/BMS to the LAD and BMS to the LCX in April 2012. Follow up stress Myoview study in December 2013 showed normal perfusion with an ejection fraction of 51%. On followup today he reports he is doing very well from a cardiac standpoint. He denies any chest pain or shortness of breath. 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 He still has muscle aches and pains from his statin therapy and is currently taking Lipitor 20 mg per day.  Current Outpatient Prescriptions on File Prior to Visit  Medication Sig Dispense Refill  . acetaminophen (TYLENOL) 500 MG tablet Take 1,000 mg by mouth every 6 (six) hours as needed. 2 tablets       . aspirin 81 MG EC tablet Take 81 mg by mouth daily.        Marland Kitchen atorvastatin (LIPITOR) 20 MG tablet Take 1 tablet (20 mg total) by mouth daily.  90 tablet  3  . co-enzyme Q-10 30 MG capsule Take 100 mg by mouth daily.       . fish oil-omega-3 fatty acids 1000 MG capsule Take 1 g by mouth daily.        . Flaxseed, Linseed, (FLAX SEED OIL PO) Take by mouth daily.        . multivitamin (THERAGRAN) per tablet Take 1 tablet by mouth daily.        . nitroGLYCERIN (NITROSTAT) 0.4 MG SL tablet Place 0.4 mg under the tongue every 5 (five) minutes as needed.         No current facility-administered medications on file prior to visit.    Allergies  Allergen Reactions  . Ace Inhibitors     cough  . Naproxen Nausea And Vomiting    Past Medical History  Diagnosis Date  . Hyperlipidemia   . Myocardial infarction, anterior wall, subsequent care   . Bradycardia     intolerant of bradycardia  . Ventricular dysfunction     left (mild); EF is 40% per cath 4/12; improved to 55% per echo in July 2012  . Coronary artery disease   . History of acute anterior wall MI 4/12    BMS to the LAD  . S/P coronary artery  stent placement 4/12    BMS to the mid LCX  . Arthralgia     Past Surgical History  Procedure Laterality Date  . Coronary stent placement  02/17/2011    BMS to the LAD  . Vasectomy    . Coronary stent placement  03/15/11    BMS to the mid LCX    History  Smoking status  . Never Smoker   Smokeless tobacco  . Never Used    History  Alcohol Use No    Family History  Problem Relation Age of Onset  . Anemia Mother   . Cancer Mother     liver / lung  . Anxiety disorder Mother   . Anxiety disorder Father   . Heart attack Father   . Heart failure Father   . Hyperlipidemia Mother     Review of Systems: The review of systems is per the HPI.  All other systems were reviewed and are negative.  Physical Exam: BP 132/86  Pulse 62  Ht 5\' 11"  (1.803 m)  Wt 207 lb 6.4 oz (94.076 kg)  BMI 28.94 kg/m2 Patient  is very pleasant and in no acute distress. Skin is warm and dry. Color is normal.  HEENT is unremarkable. Normocephalic/atraumatic. PERRL. Sclera are nonicteric. Neck is supple. No masses. No JVD. Lungs are clear. Cardiac exam shows a regular rate and rhythm. Abdomen is soft. Extremities are without edema. Gait and ROM are intact. No gross neurologic deficits noted.   LABORATORY DATA: ECG today demonstrates normal sinus rhythm with a normal ECG. Rate is 63 beats per minute.   Assessment / Plan: 1. Coronary disease status post myocardial infarction with subsequent stenting of the LAD and left circumflex coronary in 2012 with bare-metal stents. Normal Myoview study in December 2013. We will continue with aspirin and statin therapy. He may require stress test for DOT clearance. Based on his stress Myoview this could be just a routine stress test without imaging. 2. Dyslipidemia-on Lipitor.

## 2013-07-22 NOTE — Patient Instructions (Signed)
Continue your current therapy  I will see you in one year   

## 2013-11-01 ENCOUNTER — Telehealth: Payer: Self-pay | Admitting: Cardiology

## 2013-11-01 NOTE — Telephone Encounter (Signed)
Returned call to patient he stated he cannot take Lipitor, causing severe pain in legs.Stated he can hardly walk when working.Advised to stop Lipitor.Dr.Jordan out of office, will send message to him for advice.

## 2013-11-01 NOTE — Telephone Encounter (Signed)
I recommend crestor 5 mg every other day to see if he can tolerate low dose.  Tremel Setters Swaziland MD, [facility]

## 2013-11-01 NOTE — Telephone Encounter (Signed)
New message     On lipitor but want to change back to crestor

## 2013-11-02 MED ORDER — ROSUVASTATIN CALCIUM 5 MG PO TABS: ORAL_TABLET | ORAL | Status: DC

## 2013-11-02 NOTE — Telephone Encounter (Signed)
Returned call to patient Dr.Jordan advised stop Lipitor and start Crestor 5 mg every other day.Advised to call back if has any problems.

## 2013-11-02 NOTE — Addendum Note (Signed)
Addended by: Meda Klinefelter D on: 11/02/2013 09:16 AM   Modules accepted: Orders, Medications

## 2014-10-27 ENCOUNTER — Telehealth: Payer: Self-pay

## 2014-10-27 MED ORDER — ROSUVASTATIN CALCIUM 5 MG PO TABS
ORAL_TABLET | ORAL | Status: DC
Start: 2014-10-27 — End: 2015-01-26

## 2014-10-27 NOTE — Telephone Encounter (Signed)
Spoke to patient he stated he needed refill for crestor.Advised he needs appointment with Dr.Jordan.Appointment scheduled with Dr.Jordan 01/02/15 at 4:00 pm.Crestor refill sent to pharmacy.

## 2014-11-02 ENCOUNTER — Telehealth: Payer: Self-pay | Admitting: Cardiology

## 2014-11-02 NOTE — Telephone Encounter (Signed)
New Prob     Needing an alternative for rosuvastatin (CRESTOR) 5 MG tablet. Please call.

## 2014-11-02 NOTE — Telephone Encounter (Signed)
Please call-Reference#[id number].

## 2014-11-02 NOTE — Telephone Encounter (Signed)
Prior auth done for Crestor - approved 10/03/14 through 11/02/15 #[id number].

## 2014-11-17 ENCOUNTER — Other Ambulatory Visit: Payer: Self-pay | Admitting: *Deleted

## 2015-01-02 ENCOUNTER — Ambulatory Visit: Payer: BC Managed Care – PPO | Admitting: Cardiology

## 2015-01-26 ENCOUNTER — Encounter: Payer: Self-pay | Admitting: Cardiology

## 2015-01-26 ENCOUNTER — Ambulatory Visit (INDEPENDENT_AMBULATORY_CARE_PROVIDER_SITE_OTHER): Payer: BLUE CROSS/BLUE SHIELD | Admitting: Cardiology

## 2015-01-26 VITALS — BP 148/80 | HR 69 | Ht 70.0 in | Wt 220.4 lb

## 2015-01-26 DIAGNOSIS — R030 Elevated blood-pressure reading, without diagnosis of hypertension: Secondary | ICD-10-CM

## 2015-01-26 DIAGNOSIS — E785 Hyperlipidemia, unspecified: Secondary | ICD-10-CM

## 2015-01-26 DIAGNOSIS — I25118 Atherosclerotic heart disease of native coronary artery with other forms of angina pectoris: Secondary | ICD-10-CM

## 2015-01-26 DIAGNOSIS — IMO0001 Reserved for inherently not codable concepts without codable children: Secondary | ICD-10-CM | POA: Insufficient documentation

## 2015-01-26 MED ORDER — ROSUVASTATIN CALCIUM 10 MG PO TABS: 10.0000 mg | ORAL_TABLET | Freq: Every day | ORAL | Status: DC

## 2015-01-26 NOTE — Patient Instructions (Signed)
We will schedule you for a stress test and fasting lab work  Watch your blood pressure at home.  Work on eating a healthy diet

## 2015-01-27 ENCOUNTER — Encounter: Payer: Self-pay | Admitting: Cardiology

## 2015-01-27 ENCOUNTER — Telehealth: Payer: Self-pay | Admitting: Cardiology

## 2015-01-27 NOTE — Progress Notes (Signed)
Patrick Berg Date of Birth: Oct 14, 1956 Medical Record #[id number]  History of Present Illness: Patrick Berg is seen today for follow up CAD. He is status post  MI/BMS to the LAD and BMS to the LCX in April 2012. Follow up stress Myoview study in December 2013 showed normal perfusion with an ejection fraction of 51%. On followup today he reports he is doing very well from a cardiac standpoint.  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 His job status has changed and he is not having to do as much of the heavy work. He does report one episode of chest pain after pulling a heavy hose. This was mid sternal and described as an ache. He got relief with ASA and Tylenol. He was concerned however with his prior cardiac history.   Current Outpatient Prescriptions on File Prior to Visit  Medication Sig Dispense Refill  . acetaminophen (TYLENOL) 500 MG tablet Take 1,000 mg by mouth every 6 (six) hours as needed. 2 tablets     . aspirin 81 MG EC tablet Take 81 mg by mouth daily.      . multivitamin (THERAGRAN) per tablet Take 1 tablet by mouth daily.      . nitroGLYCERIN (NITROSTAT) 0.4 MG SL tablet Place 0.4 mg under the tongue every 5 (five) minutes as needed.       No current facility-administered medications on file prior to visit.    Allergies  Allergen Reactions  . Ace Inhibitors     cough  . Lipitor [Atorvastatin] Other (See Comments)    SEVERE LEG PAIN  . Naproxen Nausea And Vomiting    Past Medical History  Diagnosis Date  . Hyperlipidemia   . Myocardial infarction, anterior wall, subsequent care   . Bradycardia     intolerant of bradycardia  . Ventricular dysfunction     left (mild); EF is 40% per cath 4/12; improved to 55% per echo in July 2012  . Coronary artery disease   . History of acute anterior wall MI 4/12    BMS to the LAD  . S/P coronary artery stent placement 4/12    BMS to the mid LCX  . Arthralgia     Past Surgical History  Procedure Laterality Date  .  Coronary stent placement  02/17/2011    BMS to the LAD  . Vasectomy    . Coronary stent placement  03/15/11    BMS to the mid LCX    History  Smoking status  . Never Smoker   Smokeless tobacco  . Never Used    History  Alcohol Use No    Family History  Problem Relation Age of Onset  . Anemia Mother   . Cancer Mother     liver / lung  . Anxiety disorder Mother   . Anxiety disorder Father   . Heart attack Father   . Heart failure Father   . Hyperlipidemia Mother     Review of Systems: The review of systems is per the HPI.  All other systems were reviewed and are negative.  Physical Exam: BP 148/80 mmHg  Pulse 69  Ht 5\' 10"  (1.778 m)  Wt 220 lb 6.4 oz (99.973 kg)  BMI 31.62 kg/m2 Patient is very pleasant and in no acute distress. Skin is warm and dry. Color is normal.  HEENT is unremarkable. Normocephalic/atraumatic. PERRL. Sclera are nonicteric. Neck is supple. No masses. No JVD. Lungs are clear. Cardiac exam shows a regular rate and rhythm. Abdomen  is soft. Extremities are without edema. Gait and ROM are intact. No gross neurologic deficits noted.   LABORATORY DATA: ECG today demonstrates normal sinus rhythm with a normal ECG. Rate is 69 beats per minute. I have personally reviewed and interpreted this study.    Assessment / Plan: 1. Coronary disease status post myocardial infarction with subsequent stenting of the LAD and left circumflex coronary in 2012 with bare-metal stents. Normal Myoview study in December 2013. We will continue with aspirin and statin therapy. He had one episode of atypical chest pain 2 weeks ago. Will arrange for a follow up ETT.  2. Dyslipidemia-on Lipitor. Will arrange for fasting labs including lipids and chemistries  3. Elevated BP. He is going to monitor at home and see if is persistently high.   Encourage weight loss and heart healthy diet.

## 2015-01-27 NOTE — Telephone Encounter (Signed)
Left message for patient to call and schedule GXT ordered by Dr. SwazilandJordan.

## 2015-02-22 ENCOUNTER — Telehealth: Payer: Self-pay

## 2015-02-22 NOTE — Telephone Encounter (Signed)
Spoke to patient received a message you needed to ask a question about upcoming treadmill test.Patient stated he had to reschedule treadmill due his job.Also stated he will have fasting lab that same day.Patient was given directions to [facility] lab here at [place] office.

## 2015-02-23 ENCOUNTER — Encounter (HOSPITAL_COMMUNITY): Payer: BLUE CROSS/BLUE SHIELD

## 2015-03-17 ENCOUNTER — Telehealth (HOSPITAL_COMMUNITY): Payer: Self-pay

## 2015-03-17 NOTE — Telephone Encounter (Signed)
Encounter complete. 

## 2015-03-22 ENCOUNTER — Ambulatory Visit (HOSPITAL_COMMUNITY)
Admission: RE | Admit: 2015-03-22 | Discharge: 2015-03-22 | Disposition: A | Discharge status: Home / Self Care | Payer: BLUE CROSS/BLUE SHIELD | Source: Ambulatory Visit | Attending: Internal Medicine | Admitting: Internal Medicine

## 2015-03-22 ENCOUNTER — Encounter (HOSPITAL_COMMUNITY): Payer: BLUE CROSS/BLUE SHIELD

## 2015-03-22 DIAGNOSIS — I25118 Atherosclerotic heart disease of native coronary artery with other forms of angina pectoris: Secondary | ICD-10-CM

## 2015-03-22 DIAGNOSIS — E785 Hyperlipidemia, unspecified: Secondary | ICD-10-CM | POA: Insufficient documentation

## 2015-03-22 DIAGNOSIS — R030 Elevated blood-pressure reading, without diagnosis of hypertension: Secondary | ICD-10-CM

## 2015-03-22 DIAGNOSIS — IMO0001 Reserved for inherently not codable concepts without codable children: Secondary | ICD-10-CM

## 2015-03-23 LAB — HEPATIC FUNCTION PANEL
ALK PHOS: 55 U/L (ref 39–117)
ALT: 17 U/L (ref 0–53)
AST: 20 U/L (ref 0–37)
Albumin: 4 g/dL (ref 3.5–5.2)
Bilirubin, Direct: 0.2 mg/dL (ref 0.0–0.3)
Indirect Bilirubin: 1.1 mg/dL (ref 0.2–1.2)
TOTAL PROTEIN: 6.8 g/dL (ref 6.0–8.3)
Total Bilirubin: 1.3 mg/dL — ABNORMAL HIGH (ref 0.2–1.2)

## 2015-03-23 LAB — LIPID PANEL
Cholesterol: 191 mg/dL (ref 0–200)
HDL: 37 mg/dL — ABNORMAL LOW (ref >=40)
LDL Cholesterol: 127 mg/dL — ABNORMAL HIGH (ref 0–99)
Total CHOL/HDL Ratio: 5.2 Ratio
Triglycerides: 136 mg/dL (ref <150)
VLDL: 27 mg/dL (ref 0–40)

## 2015-03-23 LAB — BASIC METABOLIC PANEL
BUN: 20 mg/dL (ref 6–23)
CO2: 29 mEq/L (ref 19–32)
Calcium: 9 mg/dL (ref 8.4–10.5)
Chloride: 104 mEq/L (ref 96–112)
Creat: 0.68 mg/dL (ref 0.50–1.35)
GLUCOSE: 91 mg/dL (ref 70–99)
POTASSIUM: 4.5 meq/L (ref 3.5–5.3)
Sodium: 139 mEq/L (ref 135–145)

## 2015-03-27 LAB — EXERCISE TOLERANCE TEST
CHL CUP RESTING HR STRESS: 67 {beats}/min
CHL CUP STRESS STAGE 1 GRADE: 0 %
CHL CUP STRESS STAGE 1 HR: 68 {beats}/min
CHL CUP STRESS STAGE 1 SBP: 128 mmHg
CHL CUP STRESS STAGE 11 SPEED: 0 mph
CHL CUP STRESS STAGE 12 GRADE: 0 %
CHL CUP STRESS STAGE 12 HR: 69 {beats}/min
CHL CUP STRESS STAGE 12 SBP: 181 mmHg
CHL CUP STRESS STAGE 2 HR: 68 {beats}/min
CHL CUP STRESS STAGE 3 HR: 68 {beats}/min
CHL CUP STRESS STAGE 4 GRADE: 0 %
CHL CUP STRESS STAGE 5 HR: 66 {beats}/min
CHL CUP STRESS STAGE 6 GRADE: 10 %
CHL CUP STRESS STAGE 6 SPEED: 1.7 mph
CHL CUP STRESS STAGE 7 DBP: 88 mmHg
CHL CUP STRESS STAGE 7 GRADE: 12 %
CHL CUP STRESS STAGE 7 HR: 111 {beats}/min
CHL CUP STRESS STAGE 7 SBP: 150 mmHg
CHL CUP STRESS STAGE 7 SPEED: 2.5 mph
CHL CUP STRESS STAGE 8 GRADE: 14 %
CSEPPMHR: 87 %
Estimated workload: 13.4 METS
Exercise duration (min): 12 min
MPHR: 161 {beats}/min
Peak HR: 141 {beats}/min
Percent HR: 88 %
RPE: 23124
Stage 1 DBP: 91 mmHg
Stage 1 Speed: 0 mph
Stage 10 Grade: 16 %
Stage 10 HR: 141 {beats}/min
Stage 10 Speed: 4.2 mph
Stage 11 Grade: 0 %
Stage 11 HR: 117 {beats}/min
Stage 12 DBP: 94 mmHg
Stage 12 Speed: 0 mph
Stage 2 Grade: 0 %
Stage 2 Speed: 0 mph
Stage 3 Grade: 0 %
Stage 3 Speed: 0 mph
Stage 4 HR: 67 {beats}/min
Stage 4 Speed: 1 mph
Stage 5 Grade: 0.1 %
Stage 5 Speed: 1 mph
Stage 6 DBP: 93 mmHg
Stage 6 HR: 97 {beats}/min
Stage 6 SBP: 157 mmHg
Stage 8 HR: 122 {beats}/min
Stage 8 Speed: 3.4 mph
Stage 9 Grade: 16 %
Stage 9 HR: 141 {beats}/min
Stage 9 Speed: 4.2 mph

## 2015-03-28 ENCOUNTER — Encounter (HOSPITAL_COMMUNITY): Payer: Self-pay | Admitting: *Deleted

## 2015-03-30 ENCOUNTER — Other Ambulatory Visit: Payer: Self-pay

## 2015-03-30 DIAGNOSIS — E785 Hyperlipidemia, unspecified: Secondary | ICD-10-CM

## 2015-03-30 MED ORDER — EZETIMIBE 10 MG PO TABS: 10.0000 mg | ORAL_TABLET | Freq: Every day | ORAL | Status: DC

## 2015-07-14 LAB — LIPID PANEL
CHOL/HDL RATIO: 3.3 ratio (ref <=5.0)
Cholesterol: 140 mg/dL (ref 125–200)
HDL: 43 mg/dL (ref >=40)
LDL Cholesterol: 82 mg/dL (ref <130)
Triglycerides: 77 mg/dL (ref <150)
VLDL: 15 mg/dL (ref <30)

## 2015-07-14 LAB — HEPATIC FUNCTION PANEL
ALT: 19 U/L (ref 9–46)
AST: 23 U/L (ref 10–35)
Albumin: 4.3 g/dL (ref 3.6–5.1)
Alkaline Phosphatase: 58 U/L (ref 40–115)
Bilirubin, Direct: 0.3 mg/dL — ABNORMAL HIGH (ref <=0.2)
Indirect Bilirubin: 1.3 mg/dL — ABNORMAL HIGH (ref 0.2–1.2)
Total Bilirubin: 1.6 mg/dL — ABNORMAL HIGH (ref 0.2–1.2)
Total Protein: 7 g/dL (ref 6.1–8.1)

## 2015-07-18 ENCOUNTER — Telehealth: Payer: Self-pay | Admitting: Cardiology

## 2015-07-18 NOTE — Telephone Encounter (Signed)
Patient states he and his wife read that a side effect from using  Zetia may effect his bilirubin. He states since his bilirubin is slightly elevated  Direct 0.3,indirect 1.3 indirect 1.3. He wanted to know if he should be using medication or take 1/2 of tablet.  RN informed patient will defer to Dr Swaziland?

## 2015-07-18 NOTE — Telephone Encounter (Signed)
SPOKE TO PATIENT PATIENT AWARE. HE WILL CONTINUE WITH TAKING MEDICATION

## 2015-07-18 NOTE — Telephone Encounter (Signed)
Bilirubin has been mildly elevated dating back to 2012 before he started Zetia. This could be related to statin but statins usually cause elevation of transaminases. I suspect this is benign (perhaps something like Gilbert's) but should be looked at by primary care.   Camil Wilhelmsen [place]az[place] MD, [facility]

## 2015-07-18 NOTE — Telephone Encounter (Signed)
Patrick Berg is calling about a side effect of Zeita . Please call   Thanks

## 2015-11-01 ENCOUNTER — Telehealth: Payer: Self-pay | Admitting: Cardiology

## 2015-11-01 NOTE — Telephone Encounter (Signed)
Please call,concerning his papers for his DOT and something else.

## 2015-11-02 NOTE — Telephone Encounter (Signed)
Returned call to patient 11/01/15.Stated he needed DOT clearance from Dr.Jordan.Appointment scheduled with Dr.Jordan 11/22/15 at 11:45 am.

## 2015-11-22 ENCOUNTER — Encounter: Payer: Self-pay | Admitting: Cardiology

## 2015-11-22 ENCOUNTER — Ambulatory Visit (INDEPENDENT_AMBULATORY_CARE_PROVIDER_SITE_OTHER): Payer: BLUE CROSS/BLUE SHIELD | Admitting: Cardiology

## 2015-11-22 ENCOUNTER — Telehealth: Payer: Self-pay | Admitting: Cardiology

## 2015-11-22 VITALS — BP 186/98 | HR 68 | Ht 70.0 in | Wt 224.4 lb

## 2015-11-22 DIAGNOSIS — I10 Essential (primary) hypertension: Secondary | ICD-10-CM

## 2015-11-22 DIAGNOSIS — I2510 Atherosclerotic heart disease of native coronary artery without angina pectoris: Secondary | ICD-10-CM | POA: Diagnosis not present

## 2015-11-22 DIAGNOSIS — E785 Hyperlipidemia, unspecified: Secondary | ICD-10-CM | POA: Diagnosis not present

## 2015-11-22 MED ORDER — IRBESARTAN 150 MG PO TABS: 150.0000 mg | ORAL_TABLET | Freq: Every day | ORAL | Status: DC

## 2015-11-22 NOTE — Telephone Encounter (Signed)
Returned call to patient no answer.Left message on personal voice mail Avapro prescription sent to [facility] and also to mail order pharmacy.

## 2015-11-22 NOTE — Patient Instructions (Signed)
We will schedule you for an Echocardiogram  We will start you on Avapro 150 mg daily for blood pressure   We will have you follow up with Belenda CruiseKristin in our Hypertension clinic to monitor your response and we will check fasting lab work.

## 2015-11-22 NOTE — Telephone Encounter (Signed)
Pt went to pharmacy to pick up his new Avapro,it was not there. Please call him,when you call it in.

## 2015-11-22 NOTE — Progress Notes (Signed)
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 He is status post  MI/BMS to the LAD and BMS to the LCX in April 2012. Follow up stress Myoview study in December 2013 showed normal perfusion with an ejection fraction of 51%. ETT done Mar 22, 2015 was normal. On followup today he reports he is doing very well from a cardiac standpoint.  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 He denies any chest pain or dyspnea. His BP has been consistently elevated and recent readings on a DOT physical were elevated as well.   Current Outpatient Prescriptions on File Prior to Visit  Medication Sig Dispense Refill  . acetaminophen (TYLENOL) 500 MG tablet Take 1,000 mg by mouth every 6 (six) hours as needed. 2 tablets     . aspirin 81 MG EC tablet Take 81 mg by mouth daily.      . Coenzyme Q10 (CO Q 10) 100 MG CAPS Take 1 capsule by mouth daily.    Marland Kitchen ezetimibe (ZETIA) 10 MG tablet Take 1 tablet (10 mg total) by mouth daily. 90 tablet 3  . multivitamin (THERAGRAN) per tablet Take 1 tablet by mouth daily.      . nitroGLYCERIN (NITROSTAT) 0.4 MG SL tablet Place 0.4 mg under the tongue every 5 (five) minutes as needed.      . rosuvastatin (CRESTOR) 10 MG tablet Take 1 tablet (10 mg total) by mouth daily. 90 tablet 3   No current facility-administered medications on file prior to visit.    Allergies  Allergen Reactions  . Ace Inhibitors     cough  . Lipitor [Atorvastatin] Other (See Comments)    SEVERE LEG PAIN  . Naproxen Nausea And Vomiting    Past Medical History  Diagnosis Date  . Hyperlipidemia   . Myocardial infarction, anterior wall, subsequent care   . Bradycardia     intolerant of bradycardia  . Ventricular dysfunction     left (mild); EF is 40% per cath 4/12; improved to 55% per echo in July 2012  . Coronary artery disease   . History of acute anterior wall MI 4/12    BMS to the LAD   . S/P coronary artery stent placement 4/12    BMS to the mid LCX  . Arthralgia     Past Surgical History  Procedure Laterality Date  . Coronary stent placement  02/17/2011    BMS to the LAD  . Vasectomy    . Coronary stent placement  03/15/11    BMS to the mid LCX    History  Smoking status  . Never Smoker   Smokeless tobacco  . Never Used    History  Alcohol Use No    Family History  Problem Relation Age of Onset  . Anemia Mother   . Cancer Mother     liver / lung  . Anxiety disorder Mother   . Anxiety disorder Father   . Heart attack Father   . Heart failure Father   . Hyperlipidemia Mother     Review of Systems: The review of systems is per the HPI.  All other systems were reviewed and are negative.  Physical Exam: BP 186/98 mmHg  Pulse 68  Ht 5\' 10"  (1.778 m)  Wt 101.787 kg (224 lb 6.4 oz)  BMI 32.20 kg/m2 Patient is very pleasant and in no acute distress. Skin is warm and  dry. Color is normal.  HEENT is unremarkable. Normocephalic/atraumatic. PERRL. Sclera are nonicteric. Neck is supple. No masses. No JVD. Lungs are clear. Cardiac exam shows a regular rate and rhythm. Abdomen is soft. Extremities are without edema. Gait and ROM are intact. No gross neurologic deficits noted.   LABORATORY DATA:  Stress Findings    ECG Baseline ECG is normal.  There was no ST segment deviation noted during stress.  Arrhythmias during stress: none.  Arrhythmias during recovery: none.  There were no significant arrhythmias noted during the test.  ECG was interpretable.   Stress Findings The patient exercised following the Bruce protocol.    The patient reported shortness of breath during the stress test. The patient experienced no angina during the stress test.   Blood pressure and heart rate demonstrated a normal response to exercise. Heart rate demonstrated normal response to exercise. Overall, the patient's exercise capacity was normal.    [facility] Treadmill  Score: low risk The patient's response to exercise was adequate for diagnosis. The patient had an good exercise tolerance. There was no chest pain. There was an appropriate level of dyspnea. There were rare PVCs, a normal heart rate response and normal BP response. There were no ischemic ST T wave changes and a normal heart rate recovery.  Negative adequate ETT. No further testing is indicated. Based on the above I gave the patient a prescription for exercise.    Stress Measurements    Baseline Vitals  Rest HR 67 bpm    Rest BP 131/91 mmHg    Exercise Time  Exercise duration (min) 12 min    Peak Stress Vitals  Peak HR 141 BPM    Exercise Data  MPHR 161 bpm    Percent HR 88 %    RPE 23,124     Estimated workload 13.4 METS         Signed    Electronically signed by Rollene RotundaJames Hochrein, MD on 03/27/15 at 1030 EDT    Lab Results  Component Value Date   WBC 7.1 03/16/2011   HGB 13.7 03/16/2011   HCT 40.1 03/16/2011   PLT 180 03/16/2011   GLUCOSE 91 03/22/2015   CHOL 140 07/14/2015   TRIG 77 07/14/2015   HDL 43 07/14/2015   LDLCALC 82 07/14/2015   ALT 19 07/14/2015   AST 23 07/14/2015   NA 139 03/22/2015   K 4.5 03/22/2015   CL 104 03/22/2015   CREATININE 0.68 03/22/2015   BUN 20 03/22/2015   CO2 29 03/22/2015   TSH 2.113 02/17/2011   INR 1.2* 03/07/2011   HGBA1C  02/17/2011    5.3 (NOTE)                                                                       According to the ADA Clinical Practice Recommendations for 2011, when HbA1c is used as a screening test:   >=6.5%   Diagnostic of Diabetes Mellitus           (if abnormal result  is confirmed)  5.7-6.4%   Increased risk of developing Diabetes Mellitus  References:Diagnosis and Classification of Diabetes Mellitus,Diabetes Care,2011,34(Suppl 1):S62-S69 and Standards of Medical Care in         Diabetes - 2011,Diabetes  ZOXW,9604,54  (Suppl 1):S11-S61.     Assessment / Plan: 1. Coronary disease status post  myocardial infarction with subsequent stenting of the LAD and left circumflex coronary in 2012 with bare-metal stents. Normal Myoview study in December 2013. Normal ETT in May 2016. We will continue with aspirin and statin therapy. He needs Echo for DOT clearance and we will schedule.  2. Dyslipidemia-on Lipitor. Excellent control.  3. HTN- persistent. Will need drug therapy to treat. Will start Avapro 150 mg daily. Prior intolerance to lisinopril due to cough. Needs to focus on lifestyle modification with weight loss and exercise. Will arrange follow up in HTN clinic with Belenda Cruise in 3 weeks with fasting labs.

## 2015-11-30 ENCOUNTER — Ambulatory Visit (HOSPITAL_COMMUNITY): Payer: BLUE CROSS/BLUE SHIELD | Attending: Cardiology

## 2015-11-30 ENCOUNTER — Other Ambulatory Visit: Payer: Self-pay

## 2015-11-30 DIAGNOSIS — I517 Cardiomegaly: Secondary | ICD-10-CM | POA: Insufficient documentation

## 2015-11-30 DIAGNOSIS — I059 Rheumatic mitral valve disease, unspecified: Secondary | ICD-10-CM | POA: Insufficient documentation

## 2015-11-30 DIAGNOSIS — I5189 Other ill-defined heart diseases: Secondary | ICD-10-CM | POA: Insufficient documentation

## 2015-11-30 DIAGNOSIS — I10 Essential (primary) hypertension: Secondary | ICD-10-CM

## 2015-11-30 DIAGNOSIS — E785 Hyperlipidemia, unspecified: Secondary | ICD-10-CM

## 2015-11-30 DIAGNOSIS — R001 Bradycardia, unspecified: Secondary | ICD-10-CM | POA: Diagnosis not present

## 2015-11-30 DIAGNOSIS — I2510 Atherosclerotic heart disease of native coronary artery without angina pectoris: Secondary | ICD-10-CM | POA: Diagnosis not present

## 2015-12-04 ENCOUNTER — Telehealth: Payer: Self-pay | Admitting: Cardiology

## 2015-12-04 NOTE — Telephone Encounter (Signed)
Pt called in stating that he was prescribed Urbesartan and he is beginning to have some adverse effects and he would like to discuss this with the nurse. Please f/u with him   Nervousness, crawling skin, restless Pain back of legs, can feel heart beat up in neck Tired 1/2 dose but still feels restless and nervousness Is not going to take any today Has taken BP 118/78 HR 72.   Appointment with Belenda CruiseKristin on Thursday at BP clinic

## 2015-12-04 NOTE — Telephone Encounter (Signed)
Status: Signed        Just have him discontinue it and keep his appt with me on Thursday            Lake Bells, RN at 12/04/2015 11:18 AM     Status: Signed         Pt called in stating that he was prescribed Urbesartan and he is beginning to have some adverse effects and he would like to discuss this with the nurse. Please f/u with him        Patient told to stop medication and follow up with Belenda Cruise on Thursday

## 2015-12-04 NOTE — Telephone Encounter (Signed)
Pt called in stating that he was prescribed Urbesartan and he is beginning to have some adverse effects and he would like to discuss this with the nurse. Please f/u with him  Thanks

## 2015-12-04 NOTE — Telephone Encounter (Signed)
Just have him discontinue it and keep his appt with me on Thursday

## 2015-12-05 LAB — BASIC METABOLIC PANEL
BUN: 16 mg/dL (ref 7–25)
CO2: 35 mmol/L — ABNORMAL HIGH (ref 20–31)
Calcium: 9.1 mg/dL (ref 8.6–10.3)
Chloride: 101 mmol/L (ref 98–110)
Creat: 0.62 mg/dL — ABNORMAL LOW (ref 0.70–1.33)
GLUCOSE: 84 mg/dL (ref 65–99)
Potassium: 4.7 mmol/L (ref 3.5–5.3)
Sodium: 139 mmol/L (ref 135–146)

## 2015-12-05 LAB — HEPATIC FUNCTION PANEL
ALBUMIN: 4.1 g/dL (ref 3.6–5.1)
ALK PHOS: 56 U/L (ref 40–115)
ALT: 30 U/L (ref 9–46)
AST: 24 U/L (ref 10–35)
Bilirubin, Direct: 0.2 mg/dL (ref <=0.2)
Indirect Bilirubin: 0.7 mg/dL (ref 0.2–1.2)
TOTAL PROTEIN: 6.5 g/dL (ref 6.1–8.1)
Total Bilirubin: 0.9 mg/dL (ref 0.2–1.2)

## 2015-12-05 LAB — LIPID PANEL
Cholesterol: 157 mg/dL (ref 125–200)
HDL: 39 mg/dL — ABNORMAL LOW (ref >=40)
LDL CALC: 89 mg/dL (ref <130)
Total CHOL/HDL Ratio: 4 Ratio (ref <=5.0)
Triglycerides: 146 mg/dL (ref <150)
VLDL: 29 mg/dL (ref <30)

## 2015-12-07 ENCOUNTER — Encounter: Payer: Self-pay | Admitting: Pharmacist Clinician (PhC)/ Clinical Pharmacy Specialist

## 2015-12-07 ENCOUNTER — Ambulatory Visit (INDEPENDENT_AMBULATORY_CARE_PROVIDER_SITE_OTHER): Payer: BLUE CROSS/BLUE SHIELD | Admitting: Pharmacist Clinician (PhC)/ Clinical Pharmacy Specialist

## 2015-12-07 VITALS — BP 146/88 | HR 72 | Ht 70.0 in | Wt 223.3 lb

## 2015-12-07 DIAGNOSIS — IMO0001 Reserved for inherently not codable concepts without codable children: Secondary | ICD-10-CM

## 2015-12-07 DIAGNOSIS — R030 Elevated blood-pressure reading, without diagnosis of hypertension: Secondary | ICD-10-CM | POA: Diagnosis not present

## 2015-12-07 MED ORDER — AMLODIPINE BESYLATE 5 MG PO TABS: 5.0000 mg | ORAL_TABLET | Freq: Every day | ORAL | Status: DC

## 2015-12-07 NOTE — Assessment & Plan Note (Addendum)
Because he did not tolerate ACEI or ARB, will start him on amlodipine 5 mg once daily.  Reviewed potential side effects and need to take medication daily.  I will see him back in 3 weeks for a follow up appointment.  Because he does not know his schedule, he will call in a week or so to arrange this.

## 2015-12-07 NOTE — Patient Instructions (Signed)
Return for a a follow up appointment in 3 week  Your blood pressure today is 146/88  (goal is < 140/80 for DOT)  Check your blood pressure at home daily and keep record of the readings.  Take your BP meds as follows: start amlodipine 5 mg once daily  Bring all of your meds, your BP cuff and your record of home blood pressures to your next appointment.  Exercise as you're able, try to walk approximately 30 minutes per day.  Keep salt intake to a minimum, especially watch canned and prepared boxed foods.  Eat more fresh fruits and vegetables and fewer canned items.  Avoid eating in fast food restaurants.    HOW TO TAKE YOUR BLOOD PRESSURE: . Rest 5 minutes before taking your blood pressure. .  Don't smoke or drink caffeinated beverages for at least 30 minutes before. . Take your blood pressure before (not after) you eat. . Sit comfortably with your back supported and both feet on the floor (don't cross your legs). . Elevate your arm to heart level on a table or a desk. . Use the proper sized cuff. It should fit smoothly and snugly around your bare upper arm. There should be enough room to slip a fingertip under the cuff. The bottom edge of the cuff should be 1 inch above the crease of the elbow. . Ideally, take 3 measurements at one sitting and record the average.

## 2015-12-07 NOTE — Progress Notes (Signed)
12/07/2015 [name] 1956-04-29 [id number]   HPI:  Patrick Berg is a 60 y.o. male patient of Dr.Jordan with a PMH below who presents today for hypertension clinic evaluation.  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  He states he has had borderline hypertension for many years.  He is a high-energy person.   He complains of headaches on a fairly regular basis, for which he keeps asa/apap/caffeine tablets on hand.  His wife is a CMA and has been checking his home blood pressure readings.  Cardiac Hx: MI with BMS x 2 in 02/2011  Family Hx: father had MI at 17 then heart transplant, lived 1 year post transplant; 1 brother with stents/MI and 1 with hypertension, son has htn at age 67  Social Hx: no tobacco or alcohol, drinks only 1 cup of coffee per day;  Diet: has recently given up fast foods/eating out; trying to do more salads, vegetables, tuna, chicken, using NoSalt  Exercise: active at work daily, does not do any formal exercise  Home BP readings: this week has been going up since d/c irbesartan (119/69 Monday to 145/85 this am)  Current antihypertensive medications: none  Intolerances: irbesartan (insomnia/nervousness), ACEI (cough)  Wt Readings from Last 3 Encounters:  12/07/15 223 lb 4.8 oz (101.288 kg)  11/22/15 224 lb 6.4 oz (101.787 kg)  01/26/15 220 lb 6.4 oz (99.973 kg)   BP Readings from Last 3 Encounters:  12/07/15 146/88  11/22/15 186/98  01/26/15 148/80   Pulse Readings from Last 3 Encounters:  12/07/15 72  11/22/15 68  01/26/15 69    Current Outpatient Prescriptions  Medication Sig Dispense Refill  . acetaminophen (TYLENOL) 500 MG tablet Take 1,000 mg by mouth every 6 (six) hours as needed. 2 tablets     . amLODipine (NORVASC) 5 MG tablet Take 1 tablet (5 mg total) by mouth daily. 30 tablet 3  . aspirin 81 MG EC  tablet Take 81 mg by mouth daily.      . Coenzyme Q10 (CO Q 10) 100 MG CAPS Take 1 capsule by mouth daily.    Marland Kitchen ezetimibe (ZETIA) 10 MG tablet Take 1 tablet (10 mg total) by mouth daily. 90 tablet 3  . multivitamin (THERAGRAN) per tablet Take 1 tablet by mouth daily.      . nitroGLYCERIN (NITROSTAT) 0.4 MG SL tablet Place 0.4 mg under the tongue every 5 (five) minutes as needed.      . rosuvastatin (CRESTOR) 10 MG tablet Take 1 tablet (10 mg total) by mouth daily. 90 tablet 3   No current facility-administered medications for this visit.    Allergies  Allergen Reactions  . Ace Inhibitors     cough  . Irbesartan Other (See Comments)    Insomnia, nervousness  . Lipitor [Atorvastatin] Other (See Comments)    SEVERE LEG PAIN  . Naproxen Nausea And Vomiting    Past Medical History  Diagnosis Date  . Hyperlipidemia   . Myocardial infarction, anterior wall, subsequent care   . Bradycardia     intolerant of bradycardia  . Ventricular dysfunction     left (mild); EF is 40% per cath 4/12; improved to 55% per echo in July 2012  . Coronary artery disease   . History of acute anterior wall MI 4/12    BMS to the LAD  .  S/P coronary artery stent placement 4/12    BMS to the mid LCX  . Arthralgia     Blood pressure 146/88, pulse 72, height  (1.778 m), weight 223 lb 4.8 oz (101.288 kg).    Phillips Hay PharmD CPP [facility]

## 2015-12-19 ENCOUNTER — Telehealth: Payer: Self-pay | Admitting: Cardiology

## 2015-12-19 NOTE — Telephone Encounter (Signed)
New message      Talk to Belenda Cruise about a new medication

## 2015-12-20 MED ORDER — AMLODIPINE BESYLATE 5 MG PO TABS: 5.0000 mg | ORAL_TABLET | Freq: Every day | ORAL | Status: DC

## 2015-12-20 NOTE — Telephone Encounter (Signed)
Follow up       *STAT* If patient is at the pharmacy, call can be transferred to refill team.   1. Which medications need to be refilled? (please list name of each medication and dose if known)  Amlodipine  2. Which pharmacy/location (including street and city if local pharmacy) is [facility]  3. Do they need a 30 day or 90 day supply? 90 day supply Also, pt cancelled appt with Whitney Muse want her to know that the medication is working well.

## 2016-03-04 ENCOUNTER — Other Ambulatory Visit: Payer: Self-pay | Admitting: *Deleted

## 2016-03-04 MED ORDER — ROSUVASTATIN CALCIUM 10 MG PO TABS: 10.0000 mg | ORAL_TABLET | Freq: Every day | ORAL | Status: DC

## 2016-03-04 NOTE — Telephone Encounter (Signed)
Rx(s) sent to pharmacy electronically.  

## 2016-04-29 ENCOUNTER — Telehealth: Payer: Self-pay | Admitting: Cardiology

## 2016-04-29 MED ORDER — EZETIMIBE 10 MG PO TABS: 10.0000 mg | ORAL_TABLET | Freq: Every day | ORAL | Status: DC

## 2016-04-29 NOTE — Telephone Encounter (Signed)
New message      *STAT* If patient is at the pharmacy, call can be transferred to refill team.   1. Which medications need to be refilled? (please list name of each medication and dose if known) Zetia pt uncertain of the strength taking daily  2. Which pharmacy/location (including street and city if local pharmacy) is [facility]  3. Do they need a 30 day or 90 day supply?90 days

## 2016-04-29 NOTE — Telephone Encounter (Signed)
Rx(s) sent to pharmacy electronically.  

## 2016-06-25 ENCOUNTER — Telehealth: Payer: Self-pay | Admitting: Cardiology

## 2016-06-25 DIAGNOSIS — E785 Hyperlipidemia, unspecified: Secondary | ICD-10-CM

## 2016-06-25 DIAGNOSIS — I2510 Atherosclerotic heart disease of native coronary artery without angina pectoris: Secondary | ICD-10-CM

## 2016-06-25 MED ORDER — AMLODIPINE BESYLATE 5 MG PO TABS: 5.0000 mg | ORAL_TABLET | Freq: Every day | ORAL | 1 refills | Status: DC

## 2016-06-25 NOTE — Telephone Encounter (Signed)
New message       *STAT* If patient is at the pharmacy, call can be transferred to refill team.   1. Which medications need to be refilled? (please list name of each medication and dose if known) amlodipine  2. Which pharmacy/location (including street and city if local pharmacy) [facility]  3. Do they need a 30 day or 90 day supply? 90 day

## 2016-06-25 NOTE — Telephone Encounter (Signed)
Rx sent to pharmacy   

## 2016-06-27 NOTE — Telephone Encounter (Signed)
Patient called no answer.Left message on personal voice mail to call me back to schedule follow up appointment with Dr.Jordan. 

## 2016-06-27 NOTE — Addendum Note (Signed)
Addended by: Neoma LamingPUGH, Leeroy Lovings J on: 06/27/2016 04:21 PM   Modules accepted: Orders

## 2016-06-27 NOTE — Telephone Encounter (Signed)
Received a call from patient.Stated he was doing well.Follow up appointment scheduled with Dr.Jordan 10/28/16 at 8:00 am.Fasting lab orders mailed to pt to have done before appointment.

## 2016-10-21 LAB — HEPATIC FUNCTION PANEL
ALK PHOS: 63 U/L (ref 40–115)
ALT: 20 U/L (ref 9–46)
AST: 22 U/L (ref 10–35)
Albumin: 4.2 g/dL (ref 3.6–5.1)
BILIRUBIN INDIRECT: 1.1 mg/dL (ref 0.2–1.2)
Bilirubin, Direct: 0.2 mg/dL (ref <=0.2)
TOTAL PROTEIN: 6.7 g/dL (ref 6.1–8.1)
Total Bilirubin: 1.3 mg/dL — ABNORMAL HIGH (ref 0.2–1.2)

## 2016-10-21 LAB — LIPID PANEL
CHOLESTEROL: 157 mg/dL (ref <200)
HDL: 43 mg/dL (ref >40)
LDL CALC: 81 mg/dL (ref <100)
TRIGLYCERIDES: 164 mg/dL — AB (ref <150)
Total CHOL/HDL Ratio: 3.7 Ratio (ref <5.0)
VLDL: 33 mg/dL — ABNORMAL HIGH (ref <30)

## 2016-10-21 LAB — BASIC METABOLIC PANEL
BUN: 17 mg/dL (ref 7–25)
CALCIUM: 9.3 mg/dL (ref 8.6–10.3)
CO2: 30 mmol/L (ref 20–31)
CREATININE: 0.69 mg/dL — AB (ref 0.70–1.25)
Chloride: 102 mmol/L (ref 98–110)
Glucose, Bld: 94 mg/dL (ref 65–99)
Potassium: 4.5 mmol/L (ref 3.5–5.3)
Sodium: 137 mmol/L (ref 135–146)

## 2016-10-26 NOTE — Progress Notes (Addendum)
Patrick Berg Date of Birth: 1956/03/05 Medical Record #[id number]  History of Present Illness: [name] is seen for follow up CAD. He is status post  MI/BMS to the LAD and BMS to the LCX in April 2012. Follow up stress Myoview study in December 2013 showed normal perfusion with an ejection fraction of 51%. ETT done Mar 22, 2015 was normal.   On followup today he reports he is doing very well from a cardiac standpoint.  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 He denies any chest pain or dyspnea. On his last visit his BP was consistently elevated and he was started on an ARB. He did not tolerate this well with severe insomnia. He is now on amlodipine and is tolerating this well. He does note some fatigue and a vague fluttering about 4-6 hours after taking his meds. No chest pain. Overall feeling well.   Current Outpatient Prescriptions on File Prior to Visit  Medication Sig Dispense Refill  . acetaminophen (TYLENOL) 500 MG tablet Take 1,000 mg by mouth every 6 (six) hours as needed. 2 tablets     . amLODipine (NORVASC) 5 MG tablet Take 1 tablet (5 mg total) by mouth daily. 90 tablet 1  . aspirin 81 MG EC tablet Take 81 mg by mouth daily.      . Coenzyme Q10 (CO Q 10) 100 MG CAPS Take 1 capsule by mouth daily.    Marland Kitchen. ezetimibe (ZETIA) 10 MG tablet Take 1 tablet (10 mg total) by mouth daily. 90 tablet 1  . multivitamin (THERAGRAN) per tablet Take 1 tablet by mouth daily.      . nitroGLYCERIN (NITROSTAT) 0.4 MG SL tablet Place 0.4 mg under the tongue every 5 (five) minutes as needed.      . rosuvastatin (CRESTOR) 10 MG tablet Take 1 tablet (10 mg total) by mouth daily. 90 tablet 2   No current facility-administered medications on file prior to visit.     Allergies  Allergen Reactions  . Ace Inhibitors     cough  . Irbesartan Other (See Comments)    Insomnia, nervousness  . Lipitor [Atorvastatin] Other (See Comments)    SEVERE LEG PAIN  . Naproxen Nausea And Vomiting     Past Medical History:  Diagnosis Date  . Arthralgia   . Bradycardia    intolerant of bradycardia  . Coronary artery disease   . History of acute anterior wall MI 4/12   BMS to the LAD  . Hyperlipidemia   . Myocardial infarction, anterior wall, subsequent care   . S/P coronary artery stent placement 4/12   BMS to the mid LCX  . Ventricular dysfunction    left (mild); EF is 40% per cath 4/12; improved to 55% per echo in July 2012    Past Surgical History:  Procedure Laterality Date  . CORONARY STENT PLACEMENT  02/17/2011   BMS to the LAD  . CORONARY STENT PLACEMENT  03/15/11   BMS to the mid LCX  . VASECTOMY      History  Smoking Status  . Never Smoker  Smokeless Tobacco  . Never Used    History  Alcohol Use No    Family History  Problem Relation Age of Onset  . Anemia Mother   . Cancer Mother     liver / lung  . Anxiety disorder Mother   . Anxiety disorder Father   . Heart attack Father   . Heart failure Father   .  Hyperlipidemia Mother     Review of Systems: The review of systems is per the HPI.  All other systems were reviewed and are negative.  Physical Exam: BP 130/62   Pulse 66   Ht 5\' 10"  (1.778 m)   Wt 217 lb (98.4 kg)   BMI 31.14 kg/m  Patient is very pleasant and in no acute distress. Skin is warm and dry. Color is normal.  HEENT is unremarkable. Normocephalic/atraumatic. PERRL. Sclera are nonicteric. Neck is supple. No masses. No JVD. Lungs are clear. Cardiac exam shows a regular rate and rhythm. Abdomen is soft. Extremities are without edema. Gait and ROM are intact. No gross neurologic deficits noted.   LABORATORY DATA:  Echo: 11/30/15: Study Conclusions  - Left ventricle: The cavity size was normal. There was mild focal   basal hypertrophy of the septum. Systolic function was normal.   The estimated ejection fraction was in the range of 55% to 60%.   There is hypokinesis of the septal myocardium. Doppler parameters   are consistent  with abnormal left ventricular relaxation (grade 1   diastolic dysfunction). - Mitral valve: Calcified annulus.  Impressions:  - Septal hypokinesis noted on apical four chamber view; overall   preserved LV function; grade 1 diastolic dysfunction.   Lab Results  Component Value Date   WBC 7.1 03/16/2011   HGB 13.7 03/16/2011   HCT 40.1 03/16/2011   PLT 180 03/16/2011   GLUCOSE 94 10/21/2016   CHOL 157 10/21/2016   TRIG 164 (H) 10/21/2016   HDL 43 10/21/2016   LDLCALC 81 10/21/2016   ALT 20 10/21/2016   AST 22 10/21/2016   NA 137 10/21/2016   K 4.5 10/21/2016   CL 102 10/21/2016   CREATININE 0.69 (L) 10/21/2016   BUN 17 10/21/2016   CO2 30 10/21/2016   TSH 2.113 02/17/2011   INR 1.2 (H) 03/07/2011   HGBA1C  02/17/2011    5.3 (NOTE)                                                                       According to the ADA Clinical Practice Recommendations for 2011, when HbA1c is used as a screening test:   >=6.5%   Diagnostic of Diabetes Mellitus           (if abnormal result  is confirmed)  5.7-6.4%   Increased risk of developing Diabetes Mellitus  References:Diagnosis and Classification of Diabetes Mellitus,Diabetes Care,2011,34(Suppl 1):S62-S69 and Standards of Medical Care in         Diabetes - 2011,Diabetes Care,2011,34  (Suppl 1):S11-S61.   Ecg today shows NSR with normal Ecg. I have personally reviewed and interpreted this study.  Assessment / Plan: 1. Coronary disease status post myocardial infarction with subsequent stenting of the LAD and left circumflex coronary in 2012 with bare-metal stents. Normal Myoview study in December 2013. Normal ETT in May 2016. We will continue with aspirin and statin therapy. Will update ETT in 6 months for DOT.   2. Dyslipidemia-on Lipitor.Good control.  3. HTN- now controlled.Prior intolerance to lisinopril due to cough. Intolerant of ARB due to insomnia. Doing well on amlodipine. Will try taking it at night. Needs to focus on  lifestyle modification with weight loss and exercise.

## 2016-10-28 ENCOUNTER — Ambulatory Visit (INDEPENDENT_AMBULATORY_CARE_PROVIDER_SITE_OTHER): Payer: BLUE CROSS/BLUE SHIELD | Admitting: Cardiology

## 2016-10-28 ENCOUNTER — Other Ambulatory Visit: Payer: Self-pay

## 2016-10-28 ENCOUNTER — Encounter: Payer: Self-pay | Admitting: Cardiology

## 2016-10-28 VITALS — BP 130/62 | HR 66 | Ht 70.0 in | Wt 217.0 lb

## 2016-10-28 DIAGNOSIS — I10 Essential (primary) hypertension: Secondary | ICD-10-CM | POA: Diagnosis not present

## 2016-10-28 DIAGNOSIS — I2510 Atherosclerotic heart disease of native coronary artery without angina pectoris: Secondary | ICD-10-CM

## 2016-10-28 DIAGNOSIS — E7800 Pure hypercholesterolemia, unspecified: Secondary | ICD-10-CM | POA: Diagnosis not present

## 2016-10-28 MED ORDER — ROSUVASTATIN CALCIUM 10 MG PO TABS: 10.0000 mg | ORAL_TABLET | Freq: Every day | ORAL | 3 refills | Status: DC

## 2016-10-28 MED ORDER — NITROGLYCERIN 0.4 MG SL SUBL: 0.4000 mg | SUBLINGUAL_TABLET | SUBLINGUAL | 11 refills | Status: DC | PRN

## 2016-10-28 MED ORDER — EZETIMIBE 10 MG PO TABS: 10.0000 mg | ORAL_TABLET | Freq: Every day | ORAL | 3 refills | Status: DC

## 2016-10-28 MED ORDER — AMLODIPINE BESYLATE 5 MG PO TABS: 5.0000 mg | ORAL_TABLET | Freq: Every day | ORAL | 3 refills | Status: DC

## 2016-10-28 NOTE — Patient Instructions (Signed)
Continue your current medication except your amlodipine at night.  We will see you in 6 months with a stress test

## 2017-06-25 ENCOUNTER — Telehealth: Payer: Self-pay | Admitting: Cardiology

## 2017-06-25 DIAGNOSIS — Z79899 Other long term (current) drug therapy: Secondary | ICD-10-CM

## 2017-06-25 DIAGNOSIS — E7800 Pure hypercholesterolemia, unspecified: Secondary | ICD-10-CM

## 2017-06-25 DIAGNOSIS — I2510 Atherosclerotic heart disease of native coronary artery without angina pectoris: Secondary | ICD-10-CM

## 2017-06-25 NOTE — Telephone Encounter (Signed)
New message    Pt is calling asking for a call back from [place]. He said he needed to talk about some issues. Please call.

## 2017-06-25 NOTE — Telephone Encounter (Signed)
Can go ahead and arrange for fasting lab work and an ETT for DOT.  Patrick Chabot [place] MD, [facility]

## 2017-06-25 NOTE — Telephone Encounter (Signed)
Spoke with patient and scheduled follow up next available, stated he was doing well. Patient wanted labs prior to visit, orders placed in Epic. Patient also requesting ETT that he is required to have for DOT  Will forward to Dr [place] for review

## 2017-06-26 NOTE — Telephone Encounter (Signed)
Returned call to patient left message on personal voice mail Dr.Jordan's recommendations.Advised you can have fasting lab done same day as ETT.Scheduler will call back with ETT appointment.

## 2017-07-01 ENCOUNTER — Telehealth: Payer: Self-pay | Admitting: Cardiology

## 2017-07-01 ENCOUNTER — Encounter: Payer: Self-pay | Admitting: *Deleted

## 2017-07-01 NOTE — Telephone Encounter (Signed)
Spoke to patient, confirmed e-mail. Informed him would send a faxed work excuse to e-mail provided. Patient verbalized thanks and understanding.  Letter printed, e-mailed to address provided. Patient aware to call if further needs.

## 2017-07-01 NOTE — Telephone Encounter (Signed)
New message   Pt needs an EMAIL sent to his boss confirming his stress test appointment this Friday on 07/04/17 at 9:45. Email: [email or web address]

## 2017-07-02 ENCOUNTER — Telehealth (HOSPITAL_COMMUNITY): Payer: Self-pay

## 2017-07-02 NOTE — Telephone Encounter (Signed)
Encounter complete. 

## 2017-07-04 ENCOUNTER — Ambulatory Visit (HOSPITAL_COMMUNITY)
Admission: RE | Admit: 2017-07-04 | Discharge: 2017-07-04 | Disposition: A | Discharge status: Home / Self Care | Payer: BLUE CROSS/BLUE SHIELD | Source: Ambulatory Visit | Attending: Cardiovascular Disease | Admitting: Cardiovascular Disease

## 2017-07-04 DIAGNOSIS — E7800 Pure hypercholesterolemia, unspecified: Secondary | ICD-10-CM | POA: Diagnosis not present

## 2017-07-04 DIAGNOSIS — I2510 Atherosclerotic heart disease of native coronary artery without angina pectoris: Secondary | ICD-10-CM | POA: Diagnosis not present

## 2017-07-04 LAB — LIPID PANEL W/O CHOL/HDL RATIO
CHOLESTEROL TOTAL: 160 mg/dL (ref 100–199)
HDL: 42 mg/dL (ref >39)
LDL CALC: 91 mg/dL (ref 0–99)
Triglycerides: 136 mg/dL (ref 0–149)
VLDL CHOLESTEROL CAL: 27 mg/dL (ref 5–40)

## 2017-07-04 LAB — BASIC METABOLIC PANEL
BUN/Creatinine Ratio: 27 — ABNORMAL HIGH (ref 10–24)
BUN: 20 mg/dL (ref 8–27)
CALCIUM: 9.3 mg/dL (ref 8.6–10.2)
CHLORIDE: 101 mmol/L (ref 96–106)
CO2: 23 mmol/L (ref 20–29)
Creatinine, Ser: 0.74 mg/dL — ABNORMAL LOW (ref 0.76–1.27)
GFR calc non Af Amer: 100 mL/min/{1.73_m2} (ref >59)
GFR, EST AFRICAN AMERICAN: 115 mL/min/{1.73_m2} (ref >59)
GLUCOSE: 97 mg/dL (ref 65–99)
POTASSIUM: 4.7 mmol/L (ref 3.5–5.2)
Sodium: 138 mmol/L (ref 134–144)

## 2017-07-04 LAB — HEPATIC FUNCTION PANEL
ALK PHOS: 66 IU/L (ref 39–117)
ALT: 19 IU/L (ref 0–44)
AST: 22 IU/L (ref 0–40)
Albumin: 4.5 g/dL (ref 3.6–4.8)
BILIRUBIN TOTAL: 1.2 mg/dL (ref 0.0–1.2)
BILIRUBIN, DIRECT: 0.26 mg/dL (ref 0.00–0.40)
TOTAL PROTEIN: 6.9 g/dL (ref 6.0–8.5)

## 2017-07-04 LAB — EXERCISE TOLERANCE TEST
CHL CUP RESTING HR STRESS: 63 {beats}/min
CSEPED: 12 min
Estimated workload: 13.4 METS
Exercise duration (sec): 1 s
MPHR: 159 {beats}/min
Peak HR: 142 {beats}/min
Percent HR: 89 %
RPE: 18

## 2017-08-11 ENCOUNTER — Telehealth: Payer: Self-pay | Admitting: Cardiology

## 2017-08-11 NOTE — Telephone Encounter (Signed)
Please call,said he needs to talk to [name]/. Pt wanted to talk to [facility] or a nurse if she is not here.

## 2017-08-11 NOTE — Telephone Encounter (Signed)
Spoke with pt, he is not able to make the appt he has on 19th. Appointment rescheduled, he is needing things for the DOT as well but he can get those when he comes for his appointment.

## 2017-08-28 ENCOUNTER — Encounter: Payer: Self-pay | Admitting: *Deleted

## 2017-09-05 ENCOUNTER — Ambulatory Visit: Payer: BLUE CROSS/BLUE SHIELD | Admitting: Cardiology

## 2017-09-07 NOTE — Progress Notes (Addendum)
Patrick Berg Date of Birth: 1956/11/15 Medical Record #[id number]  History of Present Illness: [name] is seen for follow up CAD. He is status post  MI/BMS to the LAD and BMS to the LCX in April 2012. Follow up stress Myoview study in December 2013 showed normal perfusion with an ejection fraction of 51%. ETT done July 04, 2017 was normal.   On followup today he reports he is doing very well from a cardiac standpoint.  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 He states he did get acutely SOB while spreading lime on a yard back in February. he was worried because he also had SOB with his heart attack but symptoms resolved and have not recurred. His stress test in August was normal. BP is well controlled.   Current Outpatient Prescriptions on File Prior to Visit  Medication Sig Dispense Refill  . acetaminophen (TYLENOL) 500 MG tablet Take 1,000 mg by mouth every 6 (six) hours as needed. 2 tablets     . amLODipine (NORVASC) 5 MG tablet Take 1 tablet (5 mg total) by mouth daily. 90 tablet 3  . aspirin 81 MG EC tablet Take 81 mg by mouth daily.      . Coenzyme Q10 (CO Q 10) 100 MG CAPS Take 1 capsule by mouth daily.    Marland Kitchen. ezetimibe (ZETIA) 10 MG tablet Take 1 tablet (10 mg total) by mouth daily. 90 tablet 3  . multivitamin (THERAGRAN) per tablet Take 1 tablet by mouth daily.      . nitroGLYCERIN (NITROSTAT) 0.4 MG SL tablet Place 1 tablet (0.4 mg total) under the tongue every 5 (five) minutes as needed. 25 tablet 11  . rosuvastatin (CRESTOR) 10 MG tablet Take 1 tablet (10 mg total) by mouth daily. 90 tablet 3   No current facility-administered medications on file prior to visit.     Allergies  Allergen Reactions  . Ace Inhibitors     cough  . Irbesartan Other (See Comments)    Insomnia, nervousness  . Lipitor [Atorvastatin] Other (See Comments)    SEVERE LEG PAIN  . Naproxen Nausea And Vomiting    Past Medical History:  Diagnosis Date  . Arthralgia   .  Bradycardia    intolerant of bradycardia  . Coronary artery disease   . History of acute anterior wall MI 4/12   BMS to the LAD  . Hyperlipidemia   . Myocardial infarction, anterior wall, subsequent care   . S/P coronary artery stent placement 4/12   BMS to the mid LCX  . Ventricular dysfunction    left (mild); EF is 40% per cath 4/12; improved to 55% per echo in July 2012    Past Surgical History:  Procedure Laterality Date  . CORONARY STENT PLACEMENT  02/17/2011   BMS to the LAD  . CORONARY STENT PLACEMENT  03/15/11   BMS to the mid LCX  . VASECTOMY      History  Smoking Status  . Never Smoker  Smokeless Tobacco  . Never Used    History  Alcohol Use No    Family History  Problem Relation Age of Onset  . Anxiety disorder Father   . Heart attack Father   . Heart failure Father   . Anemia Mother   . Cancer Mother        liver / lung  . Anxiety disorder Mother   . Hyperlipidemia Mother     Review of Systems: The review  of systems is per the HPI.  All other systems were reviewed and are negative.  Physical Exam: BP 136/76   Pulse 68   Ht 5\' 10"  (1.778 m)   Wt 215 lb (97.5 kg)   BMI 30.85 kg/m  GENERAL:  Well appearing WM in NAD HEENT:  PERRL, EOMI, sclera are clear. Oropharynx is clear. NECK:  No jugular venous distention, carotid upstroke brisk and symmetric, no bruits, no thyromegaly or adenopathy LUNGS:  Clear to auscultation bilaterally CHEST:  Unremarkable HEART:  RRR,  PMI not displaced or sustained,S1 and S2 within normal limits, no S3, no S4: no clicks, no rubs, no murmurs ABD:  Soft, nontender. BS +, no masses or bruits. No hepatomegaly, no splenomegaly EXT:  2 + pulses throughout, no edema, no cyanosis no clubbing SKIN:  Warm and dry.  No rashes NEURO:  Alert and oriented x 3. Cranial nerves II through XII intact. PSYCH:  Cognitively intact     LABORATORY DATA:  Echo: 11/30/15: Study Conclusions  - Left ventricle: The cavity size was  normal. There was mild focal   basal hypertrophy of the septum. Systolic function was normal.   The estimated ejection fraction was in the range of 55% to 60%.   There is hypokinesis of the septal myocardium. Doppler parameters   are consistent with abnormal left ventricular relaxation (grade 1   diastolic dysfunction). - Mitral valve: Calcified annulus.  Impressions:  - Septal hypokinesis noted on apical four chamber view; overall   preserved LV function; grade 1 diastolic dysfunction.   Lab Results  Component Value Date   WBC 7.1 03/16/2011   HGB 13.7 03/16/2011   HCT 40.1 03/16/2011   PLT 180 03/16/2011   GLUCOSE 97 07/04/2017   CHOL 160 07/04/2017   TRIG 136 07/04/2017   HDL 42 07/04/2017   LDLCALC 91 07/04/2017   ALT 19 07/04/2017   AST 22 07/04/2017   NA 138 07/04/2017   K 4.7 07/04/2017   CL 101 07/04/2017   CREATININE 0.74 (L) 07/04/2017   BUN 20 07/04/2017   CO2 23 07/04/2017   TSH 2.113 02/17/2011   INR 1.2 (H) 03/07/2011   HGBA1C  02/17/2011    5.3 (NOTE)                                                                       According to the ADA Clinical Practice Recommendations for 2011, when HbA1c is used as a screening test:   >=6.5%   Diagnostic of Diabetes Mellitus           (if abnormal result  is confirmed)  5.7-6.4%   Increased risk of developing Diabetes Mellitus  References:Diagnosis and Classification of Diabetes Mellitus,Diabetes Care,2011,34(Suppl 1):S62-S69 and Standards of Medical Care in         Diabetes - 2011,Diabetes Care,2011,34  (Suppl 1):S11-S61.   Ecg today shows NSR with normal Ecg. I have personally reviewed and interpreted this study.  ETT 07/04/17: Study Highlights    Blood pressure demonstrated a normal response to exercise.  There was no ST segment deviation noted during stress.  No T wave inversion was noted during stress.  Excellent exercise capacity.  Negative, adequate stress test.       Assessment /  Plan: 1.  Coronary disease status post myocardial infarction with subsequent stenting of the LAD and left circumflex coronary in 2012 with bare-metal stents. Normal Myoview study in December 2013. Normal ETT in May 2016 and August 2018. He remains asymptomatic. We will continue with aspirin and statin therapy. Follow up again in 6 months.  2. Dyslipidemia-on Crestor and Zetia.Good control.  3. HTN- now controlled.Prior intolerance to lisinopril due to cough. Intolerant of ARB due to insomnia. Tolerating  Amlodipine well.

## 2017-09-12 ENCOUNTER — Encounter: Payer: Self-pay | Admitting: Cardiology

## 2017-09-12 ENCOUNTER — Ambulatory Visit (INDEPENDENT_AMBULATORY_CARE_PROVIDER_SITE_OTHER): Payer: BLUE CROSS/BLUE SHIELD | Admitting: Cardiology

## 2017-09-12 VITALS — BP 136/76 | HR 68 | Ht 70.0 in | Wt 215.0 lb

## 2017-09-12 DIAGNOSIS — I2510 Atherosclerotic heart disease of native coronary artery without angina pectoris: Secondary | ICD-10-CM

## 2017-09-12 DIAGNOSIS — I10 Essential (primary) hypertension: Secondary | ICD-10-CM | POA: Diagnosis not present

## 2017-09-12 DIAGNOSIS — E785 Hyperlipidemia, unspecified: Secondary | ICD-10-CM

## 2017-09-12 NOTE — Patient Instructions (Signed)
Continue your current therapy  I will see you in 6 months.   

## 2017-09-22 ENCOUNTER — Other Ambulatory Visit: Payer: Self-pay | Admitting: Cardiology

## 2017-10-23 ENCOUNTER — Other Ambulatory Visit: Payer: Self-pay | Admitting: Cardiology

## 2017-11-18 ENCOUNTER — Other Ambulatory Visit: Payer: Self-pay | Admitting: Cardiology

## 2017-11-19 NOTE — Telephone Encounter (Signed)
Rx has been sent to the pharmacy electronically. ° °

## 2018-08-17 ENCOUNTER — Other Ambulatory Visit: Payer: Self-pay | Admitting: Cardiology

## 2018-09-09 NOTE — Progress Notes (Signed)
Patrick Berg Date of Birth: 1956/01/31 Medical Record #[id number]  History of Present Illness: Patrick Berg is seen for yearly follow up CAD. He is status post  MI/BMS to the LAD and BMS to the LCX in April 2012. Follow up stress Myoview study in December 2013 showed normal perfusion with an ejection fraction of 51%. ETT done July 04, 2017 was normal.   On followup today he reports he is doing very well from a cardiac standpoint.  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  He denies any chest pain or SOB. No palpitations. Has occasional vertigo and sinus congestion. Fell and cracked a rib earlier this year. Has occasional GERD.   Current Outpatient Medications on File Prior to Visit  Medication Sig Dispense Refill  . acetaminophen (TYLENOL) 500 MG tablet Take 1,000 mg by mouth every 6 (six) hours as needed. 2 tablets     . amLODipine (NORVASC) 5 MG tablet TAKE 1 TABLET DAILY 90 tablet 3  . aspirin 81 MG EC tablet Take 81 mg by mouth daily.      . Coenzyme Q10 (CO Q 10) 100 MG CAPS Take 1 capsule by mouth daily.    Marland Kitchen ezetimibe (ZETIA) 10 MG tablet TAKE 1 TABLET DAILY 90 tablet 3  . multivitamin (THERAGRAN) per tablet Take 1 tablet by mouth daily.      . nitroGLYCERIN (NITROSTAT) 0.4 MG SL tablet Place 1 tablet (0.4 mg total) under the tongue every 5 (five) minutes as needed. 25 tablet 11  . rosuvastatin (CRESTOR) 10 MG tablet TAKE 1 TABLET DAILY 90 tablet 3   No current facility-administered medications on file prior to visit.     Allergies  Allergen Reactions  . Ace Inhibitors     cough  . Irbesartan Other (See Comments)    Insomnia, nervousness  . Lipitor [Atorvastatin] Other (See Comments)    SEVERE LEG PAIN  . Naproxen Nausea And Vomiting    Past Medical History:  Diagnosis Date  . Arthralgia   . Bradycardia    intolerant of bradycardia  . Coronary artery disease   . History of acute anterior wall MI 4/12   BMS to the LAD  .  Hyperlipidemia   . Myocardial infarction, anterior wall, subsequent care   . S/P coronary artery stent placement 4/12   BMS to the mid LCX  . Ventricular dysfunction    left (mild); EF is 40% per cath 4/12; improved to 55% per echo in July 2012    Past Surgical History:  Procedure Laterality Date  . CORONARY STENT PLACEMENT  02/17/2011   BMS to the LAD  . CORONARY STENT PLACEMENT  03/15/11   BMS to the mid LCX  . VASECTOMY      Social History   Tobacco Use  Smoking Status Never Smoker  Smokeless Tobacco Never Used    Social History   Substance and Sexual Activity  Alcohol Use No  . Alcohol/week: 0.0 standard drinks    Family History  Problem Relation Age of Onset  . Anxiety disorder Father   . Heart attack Father   . Heart failure Father   . Anemia Mother   . Cancer Mother        liver / lung  . Anxiety disorder Mother   . Hyperlipidemia Mother     Review of Systems: The review of systems is per the HPI.  All other systems were reviewed and are negative.  Physical Exam: BP (!) 143/70   Pulse 67   Ht 5\' 9"  (1.753 m)   Wt 214 lb (97.1 kg)   BMI 31.60 kg/m  GENERAL:  Well appearing overweight WM in NAD HEENT:  PERRL, EOMI, sclera are clear. Oropharynx is clear. NECK:  No jugular venous distention, carotid upstroke brisk and symmetric, no bruits, no thyromegaly or adenopathy LUNGS:  Clear to auscultation bilaterally CHEST:  Unremarkable HEART:  RRR,  PMI not displaced or sustained,S1 and S2 within normal limits, no S3, no S4: no clicks, no rubs, no murmurs ABD:  Soft, nontender. BS +, no masses or bruits. No hepatomegaly, no splenomegaly EXT:  2 + pulses throughout, no edema, no cyanosis no clubbing SKIN:  Warm and dry.  No rashes NEURO:  Alert and oriented x 3. Cranial nerves II through XII intact. PSYCH:  Cognitively intact   LABORATORY DATA:  Echo: 11/30/15: Study Conclusions  - Left ventricle: The cavity size was normal. There was mild focal    basal hypertrophy of the septum. Systolic function was normal.   The estimated ejection fraction was in the range of 55% to 60%.   There is hypokinesis of the septal myocardium. Doppler parameters   are consistent with abnormal left ventricular relaxation (grade 1   diastolic dysfunction). - Mitral valve: Calcified annulus.  Impressions:  - Septal hypokinesis noted on apical four chamber view; overall   preserved LV function; grade 1 diastolic dysfunction.   Lab Results  Component Value Date   WBC 7.1 03/16/2011   HGB 13.7 03/16/2011   HCT 40.1 03/16/2011   PLT 180 03/16/2011   GLUCOSE 97 07/04/2017   CHOL 160 07/04/2017   TRIG 136 07/04/2017   HDL 42 07/04/2017   LDLCALC 91 07/04/2017   ALT 19 07/04/2017   AST 22 07/04/2017   NA 138 07/04/2017   K 4.7 07/04/2017   CL 101 07/04/2017   CREATININE 0.74 (L) 07/04/2017   BUN 20 07/04/2017   CO2 23 07/04/2017   TSH 2.113 02/17/2011   INR 1.2 (H) 03/07/2011   HGBA1C  02/17/2011    5.3 (NOTE)                                                                       According to the ADA Clinical Practice Recommendations for 2011, when HbA1c is used as a screening test:   >=6.5%   Diagnostic of Diabetes Mellitus           (if abnormal result  is confirmed)  5.7-6.4%   Increased risk of developing Diabetes Mellitus  References:Diagnosis and Classification of Diabetes Mellitus,Diabetes Care,2011,34(Suppl 1):S62-S69 and Standards of Medical Care in         Diabetes - 2011,Diabetes Care,2011,34  (Suppl 1):S11-S61.   Ecg today shows NSR with PACs. Anterior infarct age undetermined.  I have personally reviewed and interpreted this study.  ETT 07/04/17: Study Highlights    Blood pressure demonstrated a normal response to exercise.  There was no ST segment deviation noted during stress.  No T wave inversion was noted during stress.  Excellent exercise capacity.  Negative, adequate stress test.       Assessment / Plan: 1.  Coronary disease status post myocardial infarction with subsequent  stenting of the LAD and left circumflex coronary in 2012 with bare-metal stents. Normal Myoview study in December 2013. Normal ETT in May 2016 and August 2018. He remains asymptomatic. We will continue with aspirin and statin therapy. Follow up again in one year. Needs ETT next summer for DOT.   2. Dyslipidemia-on Crestor and Zetia. Will check fasting lab work. Goal LDL < 70.   3. HTN- borderline control.Prior intolerance to lisinopril due to cough. Intolerant of ARB due to insomnia. Tolerating  Amlodipine well. Focus on healthier diet with less sodium. Monitor BP at home with home monitor.

## 2018-09-11 ENCOUNTER — Ambulatory Visit (INDEPENDENT_AMBULATORY_CARE_PROVIDER_SITE_OTHER): Payer: Managed Care, Other (non HMO) | Admitting: Cardiology

## 2018-09-11 ENCOUNTER — Encounter: Payer: Self-pay | Admitting: Cardiology

## 2018-09-11 VITALS — BP 143/70 | HR 67 | Ht 69.0 in | Wt 214.0 lb

## 2018-09-11 DIAGNOSIS — E7800 Pure hypercholesterolemia, unspecified: Secondary | ICD-10-CM

## 2018-09-11 DIAGNOSIS — I2510 Atherosclerotic heart disease of native coronary artery without angina pectoris: Secondary | ICD-10-CM | POA: Diagnosis not present

## 2018-09-11 DIAGNOSIS — I10 Essential (primary) hypertension: Secondary | ICD-10-CM | POA: Diagnosis not present

## 2018-09-11 LAB — HEPATIC FUNCTION PANEL
ALBUMIN: 4.2 g/dL (ref 3.6–4.8)
ALK PHOS: 69 IU/L (ref 39–117)
ALT: 22 IU/L (ref 0–44)
AST: 22 IU/L (ref 0–40)
BILIRUBIN, DIRECT: 0.18 mg/dL (ref 0.00–0.40)
Bilirubin Total: 0.9 mg/dL (ref 0.0–1.2)
TOTAL PROTEIN: 6.5 g/dL (ref 6.0–8.5)

## 2018-09-11 LAB — BASIC METABOLIC PANEL
BUN / CREAT RATIO: 25 — AB (ref 10–24)
BUN: 15 mg/dL (ref 8–27)
CO2: 22 mmol/L (ref 20–29)
CREATININE: 0.6 mg/dL — AB (ref 0.76–1.27)
Calcium: 9.2 mg/dL (ref 8.6–10.2)
Chloride: 103 mmol/L (ref 96–106)
GFR, EST AFRICAN AMERICAN: 125 mL/min/{1.73_m2} (ref >59)
GFR, EST NON AFRICAN AMERICAN: 108 mL/min/{1.73_m2} (ref >59)
Glucose: 103 mg/dL — ABNORMAL HIGH (ref 65–99)
POTASSIUM: 4.9 mmol/L (ref 3.5–5.2)
SODIUM: 139 mmol/L (ref 134–144)

## 2018-09-11 LAB — LIPID PANEL W/O CHOL/HDL RATIO
Cholesterol, Total: 154 mg/dL (ref 100–199)
HDL: 37 mg/dL — ABNORMAL LOW (ref >39)
LDL CALC: 68 mg/dL (ref 0–99)
Triglycerides: 247 mg/dL — ABNORMAL HIGH (ref 0–149)
VLDL Cholesterol Cal: 49 mg/dL — ABNORMAL HIGH (ref 5–40)

## 2018-09-11 NOTE — Patient Instructions (Signed)
We will check lab work today  Watch your blood pressure at home  We will arrange a stress test next year

## 2018-09-19 ENCOUNTER — Other Ambulatory Visit: Payer: Self-pay | Admitting: Cardiology

## 2018-09-21 NOTE — Telephone Encounter (Signed)
Rx(s) sent to pharmacy electronically.  

## 2018-10-18 ENCOUNTER — Other Ambulatory Visit: Payer: Self-pay | Admitting: Cardiology

## 2019-05-20 ENCOUNTER — Telehealth: Payer: Self-pay

## 2019-05-20 NOTE — Telephone Encounter (Signed)
Called patient left message on personal voice mail he is scheduled for a treadmill 7/9 at 9:45 am.Advised he will need to have covid testing done 3 days prior.Advised to call me back on Mon 7/6 to have scheduled.

## 2019-05-24 ENCOUNTER — Other Ambulatory Visit: Payer: Managed Care, Other (non HMO)

## 2019-05-24 ENCOUNTER — Telehealth: Payer: Self-pay

## 2019-05-24 DIAGNOSIS — Z01812 Encounter for preprocedural laboratory examination: Secondary | ICD-10-CM

## 2019-05-24 DIAGNOSIS — I2510 Atherosclerotic heart disease of native coronary artery without angina pectoris: Secondary | ICD-10-CM

## 2019-05-24 NOTE — Telephone Encounter (Signed)
Spoke to patient advised he will need to have a covid test before he has treadmill on Fri 7/10.Covid drive thru test scheduled today at 4:45 pm at [facility].Advised he will need to quarantine until after his treadmill.

## 2019-05-25 ENCOUNTER — Telehealth: Payer: Self-pay | Admitting: Cardiology

## 2019-05-25 ENCOUNTER — Other Ambulatory Visit (HOSPITAL_COMMUNITY)
Admission: RE | Admit: 2019-05-25 | Discharge: 2019-05-25 | Disposition: A | Discharge status: Home / Self Care | Payer: Managed Care, Other (non HMO) | Source: Ambulatory Visit | Attending: Cardiology | Admitting: Cardiology

## 2019-05-25 ENCOUNTER — Other Ambulatory Visit: Payer: Managed Care, Other (non HMO)

## 2019-05-25 DIAGNOSIS — Z01812 Encounter for preprocedural laboratory examination: Secondary | ICD-10-CM | POA: Diagnosis not present

## 2019-05-25 DIAGNOSIS — Z1159 Encounter for screening for other viral diseases: Secondary | ICD-10-CM | POA: Insufficient documentation

## 2019-05-25 NOTE — Telephone Encounter (Signed)
New message:   Patient calling concering setting up his covid-19 test patient would like to have the latest appt at 3 or 3:45. Patient also said the message can left on vm.

## 2019-05-25 NOTE — Telephone Encounter (Signed)
S/W PT. ALL QUESTIONS ANSWERED. APPOINTMENT SCHEDULED FOR TODAY

## 2019-05-26 ENCOUNTER — Other Ambulatory Visit: Payer: Self-pay | Admitting: Cardiology

## 2019-05-26 LAB — SARS CORONAVIRUS 2 (TAT 6-24 HRS): SARS Coronavirus 2: NEGATIVE

## 2019-05-27 ENCOUNTER — Telehealth (HOSPITAL_COMMUNITY): Payer: Self-pay | Admitting: *Deleted

## 2019-05-27 NOTE — Telephone Encounter (Signed)
Close encounter 

## 2019-05-28 ENCOUNTER — Other Ambulatory Visit: Payer: Self-pay

## 2019-05-28 ENCOUNTER — Ambulatory Visit (HOSPITAL_COMMUNITY)
Admission: RE | Admit: 2019-05-28 | Discharge: 2019-05-28 | Disposition: A | Discharge status: Home / Self Care | Payer: Managed Care, Other (non HMO) | Source: Ambulatory Visit | Attending: Internal Medicine | Admitting: Internal Medicine

## 2019-05-28 DIAGNOSIS — I10 Essential (primary) hypertension: Secondary | ICD-10-CM | POA: Insufficient documentation

## 2019-05-28 DIAGNOSIS — E7800 Pure hypercholesterolemia, unspecified: Secondary | ICD-10-CM | POA: Insufficient documentation

## 2019-05-28 DIAGNOSIS — I2510 Atherosclerotic heart disease of native coronary artery without angina pectoris: Secondary | ICD-10-CM | POA: Diagnosis not present

## 2019-05-28 LAB — EXERCISE TOLERANCE TEST
Estimated workload: 13.4 METS
Exercise duration (min): 11 min
Exercise duration (sec): 15 s
MPHR: 157 {beats}/min
Peak HR: 142 {beats}/min
Percent HR: 90 %
Rest HR: 61 {beats}/min

## 2019-07-15 ENCOUNTER — Telehealth: Payer: Self-pay | Admitting: Cardiology

## 2019-07-15 NOTE — Telephone Encounter (Signed)
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

## 2019-07-15 NOTE — Telephone Encounter (Signed)
His recent stress test was normal. 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  Zareth Rippetoe [place] MD, [facility]

## 2019-07-15 NOTE — Telephone Encounter (Signed)
New Message   Patient is calling to discuss his upcoming appt in October and the necessity of the appt. Please call to discuss.

## 2019-07-15 NOTE — Telephone Encounter (Signed)
Attempted to contact patient to discuss the need for this appointment, and answer any questions.  Patient did not answer, and unable to leave message.

## 2019-07-15 NOTE — Telephone Encounter (Signed)
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 He wants to wait until spring to see if the Covid testing calms down, as he does not want to go through being tested for that as well. I advised I would route message to MD and Primary nurse to advise. Thanks!

## 2019-08-30 ENCOUNTER — Ambulatory Visit: Payer: Managed Care, Other (non HMO) | Admitting: Cardiology

## 2019-09-14 ENCOUNTER — Other Ambulatory Visit: Payer: Self-pay | Admitting: Cardiology

## 2019-11-17 ENCOUNTER — Other Ambulatory Visit: Payer: Self-pay | Admitting: Cardiology

## 2019-11-17 MED ORDER — AMLODIPINE BESYLATE 5 MG PO TABS: 5.0000 mg | ORAL_TABLET | Freq: Every day | ORAL | 0 refills | Status: DC

## 2019-11-17 MED ORDER — EZETIMIBE 10 MG PO TABS: 10.0000 mg | ORAL_TABLET | Freq: Every day | ORAL | 0 refills | Status: DC

## 2019-11-17 MED ORDER — ROSUVASTATIN CALCIUM 10 MG PO TABS: 10.0000 mg | ORAL_TABLET | Freq: Every day | ORAL | 0 refills | Status: DC

## 2019-11-17 NOTE — Telephone Encounter (Signed)
New Message   *STAT* If patient is at the pharmacy, call can be transferred to refill team.   1. Which medications need to be refilled? (please list name of each medication and dose if known) amLODipine (NORVASC) 5 MG tablet ezetimibe (ZETIA) 10 MG tablet rosuvastatin (CRESTOR) 10 MG tablet  2. Which pharmacy/location (including street and city if local pharmacy) is medication to be sent to? [facility] [place], [place]  3. Do they need a 30 day or 90 day supply? 90 day

## 2019-11-17 NOTE — Telephone Encounter (Signed)
Rx(s) sent to pharmacy electronically.  

## 2019-12-17 ENCOUNTER — Other Ambulatory Visit: Payer: Self-pay

## 2019-12-17 MED ORDER — AMLODIPINE BESYLATE 5 MG PO TABS: 5.0000 mg | ORAL_TABLET | Freq: Every day | ORAL | 0 refills | Status: DC

## 2019-12-17 MED ORDER — ROSUVASTATIN CALCIUM 10 MG PO TABS: 10.0000 mg | ORAL_TABLET | Freq: Every day | ORAL | 0 refills | Status: DC

## 2019-12-17 MED ORDER — EZETIMIBE 10 MG PO TABS: 10.0000 mg | ORAL_TABLET | Freq: Every day | ORAL | 0 refills | Status: DC

## 2019-12-20 ENCOUNTER — Telehealth: Payer: Self-pay | Admitting: Cardiology

## 2019-12-20 MED ORDER — AMLODIPINE BESYLATE 5 MG PO TABS: 5.0000 mg | ORAL_TABLET | Freq: Every day | ORAL | 0 refills | Status: DC

## 2019-12-20 MED ORDER — ROSUVASTATIN CALCIUM 10 MG PO TABS: 10.0000 mg | ORAL_TABLET | Freq: Every day | ORAL | 0 refills | Status: DC

## 2019-12-20 MED ORDER — EZETIMIBE 10 MG PO TABS: 10.0000 mg | ORAL_TABLET | Freq: Every day | ORAL | 0 refills | Status: DC

## 2019-12-20 NOTE — Telephone Encounter (Signed)
Patient is calling stating his insurance informed him they can no longer offer him [facility]. The patient would like a nurse to call him in regards to another mail service he can begin using for medications. Please advise.

## 2019-12-20 NOTE — Telephone Encounter (Signed)
Called patient, LVM to discuss other alternatives for mail service.   Left call back number.

## 2019-12-20 NOTE — Telephone Encounter (Signed)
Patient called back stating that he had the information for the pharmacy.  PPO: [phone] phone number [phone] fax number [address], [address]  I attempted to find this pharmacy in our system but unable- will have to fax over hard copies. Will route to Dr.Jordan's primary nurse.

## 2019-12-20 NOTE — Telephone Encounter (Signed)
Called patient, he states that he is unsure of what mail order his insurance uses now- I advised him to contact his insurance and find this information out and then let us know.   Patient also is out of medications, per a message from Dr.Jordan patient can wait until spring for his office visit- gave 90 day supply of medication, made upcoming visit for April with Dr.Jordan.  Patient verbalized understanding.

## 2019-12-22 NOTE — Telephone Encounter (Signed)
Called patient no answer.LMTC. 

## 2020-01-10 NOTE — Telephone Encounter (Signed)
Unable to find mail order pharmacy.Called patient left message on personal voice mail to call back with name of pharmacy.

## 2020-01-11 ENCOUNTER — Other Ambulatory Visit: Payer: Self-pay | Admitting: Cardiology

## 2020-02-25 ENCOUNTER — Ambulatory Visit: Payer: Self-pay | Attending: Internal Medicine

## 2020-02-25 DIAGNOSIS — Z23 Encounter for immunization: Secondary | ICD-10-CM

## 2020-02-25 NOTE — Progress Notes (Signed)
   Covid-19 Vaccination Clinic  Name:  Branston Halsted    MRN: [id number] DOB: [birth year]  02/25/2020  Mr. Dhanani was observed post Covid-19 immunization for 15 minutes without incident. He was provided with Vaccine Information Sheet and instruction to access the V-Safe system.   Mr. Sites was instructed to call 911 with any severe reactions post vaccine: Marland Kitchen Difficulty breathing  . Swelling of face and throat  . A fast heartbeat  . A bad rash all over body  . Dizziness and weakness   Immunizations Administered    Name Date Dose VIS Date Route   Pfizer COVID-19 Vaccine 02/25/2020  4:22 PM 0.3 mL 10/29/2019 Intramuscular   Manufacturer: [employer], [employer]   Lot: [id number]   NDC: [address]-1

## 2020-03-08 NOTE — Progress Notes (Signed)
Patrick Berg Date of Birth: 1956-06-04 Medical Record #[id number]  History of Present Illness: Patrick Berg is seen for yearly follow up CAD. He is status post  MI/BMS to the LAD and BMS to the LCX in April 2012. Follow up stress Myoview study in December 2013 showed normal perfusion with an ejection fraction of 51%. ETT done July 2020 was normal.   On followup today he reports he is doing very well from a cardiac standpoint.  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  He denies any chest pain or SOB. May have some mild palpitations when he is overly tired.   Current Outpatient Medications on File Prior to Visit  Medication Sig Dispense Refill  . acetaminophen (TYLENOL) 500 MG tablet Take 1,000 mg by mouth every 6 (six) hours as needed. 2 tablets     . amLODipine (NORVASC) 5 MG tablet Take 1 tablet (5 mg total) by mouth daily. NEEDS APPOINTMENT FOR FUTURE REFILLS 15 tablet 0  . aspirin 81 MG EC tablet Take 81 mg by mouth daily.      . Coenzyme Q10 (CO Q 10) 100 MG CAPS Take 1 capsule by mouth daily.    Marland Kitchen ezetimibe (ZETIA) 10 MG tablet TAKE 1 TABLET (10 MG TOTAL) BY MOUTH DAILY. NEEDS APPOINTMENT FOR FUTURE REFILLS 15 tablet 0  . multivitamin (THERAGRAN) per tablet Take 1 tablet by mouth daily.      . nitroGLYCERIN (NITROSTAT) 0.4 MG SL tablet Place 1 tablet (0.4 mg total) under the tongue every 5 (five) minutes as needed. 25 tablet 11  . rosuvastatin (CRESTOR) 10 MG tablet Take 1 tablet (10 mg total) by mouth daily. 15 tablet 0   No current facility-administered medications on file prior to visit.    Allergies  Allergen Reactions  . Ace Inhibitors     cough  . Irbesartan Other (See Comments)    Insomnia, nervousness  . Lipitor [Atorvastatin] Other (See Comments)    SEVERE LEG PAIN  . Naproxen Nausea And Vomiting    Past Medical History:  Diagnosis Date  . Arthralgia   . Bradycardia    intolerant of  bradycardia  . Coronary artery disease   . History of acute anterior wall MI 4/12   BMS to the LAD  . Hyperlipidemia   . Myocardial infarction, anterior wall, subsequent care   . S/P coronary artery stent placement 4/12   BMS to the mid LCX  . Ventricular dysfunction    left (mild); EF is 40% per cath 4/12; improved to 55% per echo in July 2012    Past Surgical History:  Procedure Laterality Date  . CORONARY STENT PLACEMENT  02/17/2011   BMS to the LAD  . CORONARY STENT PLACEMENT  03/15/11   BMS to the mid LCX  . VASECTOMY      Social History   Tobacco Use  Smoking Status Never Smoker  Smokeless Tobacco Never Used    Social History   Substance and Sexual Activity  Alcohol Use No  . Alcohol/week: 0.0 standard drinks    Family History  Problem Relation Age of Onset  . Anxiety disorder Father   . Heart attack Father   . Heart failure Father   . Anemia Mother   . Cancer Mother        liver / lung  . Anxiety disorder Mother   . Hyperlipidemia Mother     Review  of Systems: The review of systems is per the HPI.  All other systems were reviewed and are negative.  Physical Exam: BP 138/73   Pulse 66   Temp (!) 97.2 F (36.2 C)   Ht 5\' 10"  (1.778 m)   Wt 213 lb 9.6 oz (96.9 kg)   SpO2 97%   BMI 30.65 kg/m  GENERAL:  Well appearing  WM in NAD HEENT:  PERRL, EOMI, sclera are clear. Oropharynx is clear. NECK:  No jugular venous distention, carotid upstroke brisk and symmetric, no bruits, no thyromegaly or adenopathy LUNGS:  Clear to auscultation bilaterally CHEST:  Unremarkable HEART:  RRR,  PMI not displaced or sustained,S1 and S2 within normal limits, no S3, no S4: no clicks, no rubs, no murmurs ABD:  Soft, nontender. BS +, no masses or bruits. No hepatomegaly, no splenomegaly EXT:  2 + pulses throughout, no edema, no cyanosis no clubbing SKIN:  Warm and dry.  No rashes NEURO:  Alert and oriented x 3. Cranial nerves II through XII intact. PSYCH:  Cognitively  intact   LABORATORY DATA:  Echo: 11/30/15: Study Conclusions  - Left ventricle: The cavity size was normal. There was mild focal   basal hypertrophy of the septum. Systolic function was normal.   The estimated ejection fraction was in the range of 55% to 60%.   There is hypokinesis of the septal myocardium. Doppler parameters   are consistent with abnormal left ventricular relaxation (grade 1   diastolic dysfunction). - Mitral valve: Calcified annulus.  Impressions:  - Septal hypokinesis noted on apical four chamber view; overall   preserved LV function; grade 1 diastolic dysfunction.   Lab Results  Component Value Date   WBC 7.1 03/16/2011   HGB 13.7 03/16/2011   HCT 40.1 03/16/2011   PLT 180 03/16/2011   GLUCOSE 92 03/13/2020   CHOL 150 03/13/2020   TRIG 167 (H) 03/13/2020   HDL 47 03/13/2020   LDLCALC 75 03/13/2020   ALT 21 03/13/2020   AST 24 03/13/2020   NA 139 03/13/2020   K 4.6 03/13/2020   CL 104 03/13/2020   CREATININE 0.72 (L) 03/13/2020   BUN 13 03/13/2020   CO2 23 03/13/2020   TSH 2.113 02/17/2011   INR 1.2 (H) 03/07/2011   HGBA1C  02/17/2011    5.3 (NOTE)                                                                       According to the ADA Clinical Practice Recommendations for 2011, when HbA1c is used as a screening test:   >=6.5%   Diagnostic of Diabetes Mellitus           (if abnormal result  is confirmed)  5.7-6.4%   Increased risk of developing Diabetes Mellitus  References:Diagnosis and Classification of Diabetes Mellitus,Diabetes Care,2011,34(Suppl 1):S62-S69 and Standards of Medical Care in         Diabetes - 2011,Diabetes Care,2011,34  (Suppl 1):S11-S61.   Ecg today shows NSR rate 66. Normal Ecg.   I have personally reviewed and interpreted this study.  ETT 07/04/17: Study Highlights    Blood pressure demonstrated a normal response to exercise.  There was no ST segment deviation noted during stress.  No T wave  inversion was noted  during stress.  Excellent exercise capacity.  Negative, adequate stress test.     ETT 05/28/19: Study Highlights   Blood pressure demonstrated a normal response to exercise.  Upsloping ST segment depression was noted during stress in the II, III, aVF, V5 and V6 leads, and returning to baseline after 1-5 minutes of recovery.  Excellent exercise capacity.  Normal adequate stress test.     Assessment / Plan: 1. Coronary disease status post myocardial infarction with subsequent stenting of the LAD and left circumflex coronary in 2012 with bare-metal stents. Normal Myoview study in December 2013. Normal ETT in May 2016,  August 2018, and July 2020. He remains asymptomatic. We will continue with aspirin and statin therapy. Follow up again in one year. 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   2. Dyslipidemia-on Crestor and Zetia. Satisfactory.   3. HTN- controlled.

## 2020-03-13 ENCOUNTER — Other Ambulatory Visit: Payer: Self-pay | Admitting: Cardiology

## 2020-03-13 ENCOUNTER — Other Ambulatory Visit: Payer: Self-pay | Admitting: *Deleted

## 2020-03-13 ENCOUNTER — Other Ambulatory Visit: Payer: Self-pay

## 2020-03-13 DIAGNOSIS — E7800 Pure hypercholesterolemia, unspecified: Secondary | ICD-10-CM

## 2020-03-13 DIAGNOSIS — I10 Essential (primary) hypertension: Secondary | ICD-10-CM

## 2020-03-13 DIAGNOSIS — I2510 Atherosclerotic heart disease of native coronary artery without angina pectoris: Secondary | ICD-10-CM

## 2020-03-13 LAB — LIPID PANEL
Chol/HDL Ratio: 3.2 ratio (ref 0.0–5.0)
Cholesterol, Total: 150 mg/dL (ref 100–199)
HDL: 47 mg/dL (ref >39)
LDL Chol Calc (NIH): 75 mg/dL (ref 0–99)
Triglycerides: 167 mg/dL — ABNORMAL HIGH (ref 0–149)
VLDL Cholesterol Cal: 28 mg/dL (ref 5–40)

## 2020-03-13 LAB — BASIC METABOLIC PANEL
BUN/Creatinine Ratio: 18 (ref 10–24)
BUN: 13 mg/dL (ref 8–27)
CO2: 23 mmol/L (ref 20–29)
Calcium: 9.3 mg/dL (ref 8.6–10.2)
Chloride: 104 mmol/L (ref 96–106)
Creatinine, Ser: 0.72 mg/dL — ABNORMAL LOW (ref 0.76–1.27)
GFR calc Af Amer: 114 mL/min/{1.73_m2} (ref >59)
GFR calc non Af Amer: 99 mL/min/{1.73_m2} (ref >59)
Glucose: 92 mg/dL (ref 65–99)
Potassium: 4.6 mmol/L (ref 3.5–5.2)
Sodium: 139 mmol/L (ref 134–144)

## 2020-03-13 LAB — HEPATIC FUNCTION PANEL
ALT: 21 IU/L (ref 0–44)
AST: 24 IU/L (ref 0–40)
Albumin: 4.4 g/dL (ref 3.8–4.8)
Alkaline Phosphatase: 71 IU/L (ref 39–117)
Bilirubin Total: 0.9 mg/dL (ref 0.0–1.2)
Bilirubin, Direct: 0.22 mg/dL (ref 0.00–0.40)
Total Protein: 6.8 g/dL (ref 6.0–8.5)

## 2020-03-16 ENCOUNTER — Ambulatory Visit (INDEPENDENT_AMBULATORY_CARE_PROVIDER_SITE_OTHER): Payer: Self-pay | Admitting: Cardiology

## 2020-03-16 ENCOUNTER — Other Ambulatory Visit: Payer: Self-pay

## 2020-03-16 ENCOUNTER — Encounter: Payer: Self-pay | Admitting: Cardiology

## 2020-03-16 VITALS — BP 138/73 | HR 66 | Temp 97.2°F | Ht 70.0 in | Wt 213.6 lb

## 2020-03-16 DIAGNOSIS — I10 Essential (primary) hypertension: Secondary | ICD-10-CM

## 2020-03-16 DIAGNOSIS — I2510 Atherosclerotic heart disease of native coronary artery without angina pectoris: Secondary | ICD-10-CM

## 2020-03-16 DIAGNOSIS — E7800 Pure hypercholesterolemia, unspecified: Secondary | ICD-10-CM

## 2020-03-16 MED ORDER — EZETIMIBE 10 MG PO TABS: 10.0000 mg | ORAL_TABLET | Freq: Every day | ORAL | 3 refills | Status: DC

## 2020-03-16 MED ORDER — ROSUVASTATIN CALCIUM 10 MG PO TABS: 10.0000 mg | ORAL_TABLET | Freq: Every day | ORAL | 3 refills | Status: DC

## 2020-03-16 MED ORDER — AMLODIPINE BESYLATE 5 MG PO TABS: 5.0000 mg | ORAL_TABLET | Freq: Every day | ORAL | 3 refills | Status: DC

## 2020-03-16 MED ORDER — NITROGLYCERIN 0.4 MG SL SUBL: 0.4000 mg | SUBLINGUAL_TABLET | SUBLINGUAL | 11 refills | Status: AC | PRN

## 2020-06-26 ENCOUNTER — Telehealth: Payer: Self-pay | Admitting: Cardiology

## 2020-06-26 NOTE — Telephone Encounter (Signed)
Returned call to patient of Dr. Swaziland who reports chest discomfort/pressure over the last few days. He reports symptoms are better if he is up standing, worse when sitting down. He had discomfort while sleeping last night - he is a side-sleeper. He said he has a muscle-twitch in his chest. He reports an occasional cough that "tells you something is going on too". He has been taking ASA "just in case" but has not used NTG. He also reports pre-cramping feeling in the back of his thighs. He took a potassium supplement too, in case he had an electrolyte imbalance. He has no vital signs to report but manually checks his pulse and feelings skipping beats/pauses.  He works outdoors, in the heat. He tries to watch himself and things that he does and again states no symptoms when up doing work, only when sitting/resting  Advised will notify MD and contact him with recommendations

## 2020-06-26 NOTE — Telephone Encounter (Signed)
Patient states he has been experiencing discomfort in his chest. He denies pain and additional symptoms. No BP or HR readings available. Please return call to discuss.  ?

## 2020-06-27 NOTE — Telephone Encounter (Signed)
Spoke to patient Dr.Jordan's advice given.Stated he has been having chest discomfort for the past 4 to 5 days.He notices a pause when he is checking his pulse.Stated he is feeling alittle better this morning.Advised no extender appointments available.Spoke to DOD Dr.Fowler advised she does not have any openings.Advised to go to ED.Appointment scheduled with Dr.Jordan 8/17 at 4:30 pm.Advised if chest discomfort worsens to go to ED.

## 2020-06-27 NOTE — Telephone Encounter (Signed)
Sounds like he should be seen with Ecg to help sort out symptoms and what evaluation is necessary.  Lisvet Rasheed Swaziland MD, [facility]

## 2020-06-30 NOTE — Progress Notes (Deleted)
[name] Date of Birth: September 15, 1956 Medical Record #[id number]  History of Present Illness: Mr. Crossen is seen for yearly follow up CAD. He is status post  MI/BMS to the LAD and BMS to the LCX in April 2012. Follow up stress Myoview study in December 2013 showed normal perfusion with an ejection fraction of 51%. ETT done July 2020 was normal. When last seen in April he was doing well without symptoms. More recently he has experienced symptoms of chest pain as well as noting a pause in his heart rhythm on his watch.   On followup today he reports he is doing very well from a cardiac standpoint.  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  He denies any chest pain or SOB. May have some mild palpitations when he is overly tired.   Current Outpatient Medications on File Prior to Visit  Medication Sig Dispense Refill  . acetaminophen (TYLENOL) 500 MG tablet Take 1,000 mg by mouth every 6 (six) hours as needed. 2 tablets     . amLODipine (NORVASC) 5 MG tablet Take 1 tablet (5 mg total) by mouth daily. 90 tablet 3  . aspirin 81 MG EC tablet Take 81 mg by mouth daily.      . Coenzyme Q10 (CO Q 10) 100 MG CAPS Take 1 capsule by mouth daily.    Marland Kitchen ezetimibe (ZETIA) 10 MG tablet Take 1 tablet (10 mg total) by mouth daily. 90 tablet 3  . multivitamin (THERAGRAN) per tablet Take 1 tablet by mouth daily.      . nitroGLYCERIN (NITROSTAT) 0.4 MG SL tablet Place 1 tablet (0.4 mg total) under the tongue every 5 (five) minutes as needed. 25 tablet 11  . rosuvastatin (CRESTOR) 10 MG tablet Take 1 tablet (10 mg total) by mouth daily. 90 tablet 3   No current facility-administered medications on file prior to visit.    Allergies  Allergen Reactions  . Ace Inhibitors     cough  . Irbesartan Other (See Comments)    Insomnia, nervousness  . Lipitor [Atorvastatin] Other (See Comments)    SEVERE LEG PAIN  . Naproxen Nausea And Vomiting     Past Medical History:  Diagnosis Date  . Arthralgia   . Bradycardia    intolerant of bradycardia  . Coronary artery disease   . History of acute anterior wall MI 4/12   BMS to the LAD  . Hyperlipidemia   . Myocardial infarction, anterior wall, subsequent care   . S/P coronary artery stent placement 4/12   BMS to the mid LCX  . Ventricular dysfunction    left (mild); EF is 40% per cath 4/12; improved to 55% per echo in July 2012    Past Surgical History:  Procedure Laterality Date  . CORONARY STENT PLACEMENT  02/17/2011   BMS to the LAD  . CORONARY STENT PLACEMENT  03/15/11   BMS to the mid LCX  . VASECTOMY      Social History   Tobacco Use  Smoking Status Never Smoker  Smokeless Tobacco Never Used    Social History   Substance and Sexual Activity  Alcohol Use No  . Alcohol/week: 0.0 standard drinks    Family History  Problem Relation Age of Onset  . Anxiety disorder Father   . Heart attack Father   . Heart failure Father   . Anemia Mother   . Cancer Mother  liver / lung  . Anxiety disorder Mother   . Hyperlipidemia Mother     Review of Systems: The review of systems is per the HPI.  All other systems were reviewed and are negative.  Physical Exam: There were no vitals taken for this visit. GENERAL:  Well appearing  WM in NAD HEENT:  PERRL, EOMI, sclera are clear. Oropharynx is clear. NECK:  No jugular venous distention, carotid upstroke brisk and symmetric, no bruits, no thyromegaly or adenopathy LUNGS:  Clear to auscultation bilaterally CHEST:  Unremarkable HEART:  RRR,  PMI not displaced or sustained,S1 and S2 within normal limits, no S3, no S4: no clicks, no rubs, no murmurs ABD:  Soft, nontender. BS +, no masses or bruits. No hepatomegaly, no splenomegaly EXT:  2 + pulses throughout, no edema, no cyanosis no clubbing SKIN:  Warm and dry.  No rashes NEURO:  Alert and oriented x 3. Cranial nerves II through XII intact. PSYCH:  Cognitively  intact   LABORATORY DATA:  Echo: 11/30/15: Study Conclusions  - Left ventricle: The cavity size was normal. There was mild focal   basal hypertrophy of the septum. Systolic function was normal.   The estimated ejection fraction was in the range of 55% to 60%.   There is hypokinesis of the septal myocardium. Doppler parameters   are consistent with abnormal left ventricular relaxation (grade 1   diastolic dysfunction). - Mitral valve: Calcified annulus.  Impressions:  - Septal hypokinesis noted on apical four chamber view; overall   preserved LV function; grade 1 diastolic dysfunction.   Lab Results  Component Value Date   WBC 7.1 03/16/2011   HGB 13.7 03/16/2011   HCT 40.1 03/16/2011   PLT 180 03/16/2011   GLUCOSE 92 03/13/2020   CHOL 150 03/13/2020   TRIG 167 (H) 03/13/2020   HDL 47 03/13/2020   LDLCALC 75 03/13/2020   ALT 21 03/13/2020   AST 24 03/13/2020   NA 139 03/13/2020   K 4.6 03/13/2020   CL 104 03/13/2020   CREATININE 0.72 (L) 03/13/2020   BUN 13 03/13/2020   CO2 23 03/13/2020   TSH 2.113 02/17/2011   INR 1.2 (H) 03/07/2011   HGBA1C  02/17/2011    5.3 (NOTE)                                                                       According to the ADA Clinical Practice Recommendations for 2011, when HbA1c is used as a screening test:   >=6.5%   Diagnostic of Diabetes Mellitus           (if abnormal result  is confirmed)  5.7-6.4%   Increased risk of developing Diabetes Mellitus  References:Diagnosis and Classification of Diabetes Mellitus,Diabetes Care,2011,34(Suppl 1):S62-S69 and Standards of Medical Care in         Diabetes - 2011,Diabetes Care,2011,34  (Suppl 1):S11-S61.   Ecg today shows NSR rate 66. Normal Ecg.   I have personally reviewed and interpreted this study.  ETT 07/04/17: Study Highlights    Blood pressure demonstrated a normal response to exercise.  There was no ST segment deviation noted during stress.  No T wave inversion was noted  during stress.  Excellent exercise capacity.  Negative, adequate stress  test.     ETT 05/28/19: Study Highlights   Blood pressure demonstrated a normal response to exercise.  Upsloping ST segment depression was noted during stress in the II, III, aVF, V5 and V6 leads, and returning to baseline after 1-5 minutes of recovery.  Excellent exercise capacity.  Normal adequate stress test.     Assessment / Plan: 1. Coronary disease status post myocardial infarction with subsequent stenting of the LAD and left circumflex coronary in 2012 with bare-metal stents. Normal Myoview study in December 2013. Normal ETT in May 2016,  August 2018, and July 2020. He remains asymptomatic. We will continue with aspirin and statin therapy. Follow up again in one year. 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   2. Dyslipidemia-on Crestor and Zetia. Satisfactory.   3. HTN- controlled.

## 2020-07-04 ENCOUNTER — Ambulatory Visit: Payer: Self-pay | Admitting: Cardiology

## 2020-07-04 ENCOUNTER — Other Ambulatory Visit: Payer: Self-pay

## 2020-07-04 ENCOUNTER — Ambulatory Visit (INDEPENDENT_AMBULATORY_CARE_PROVIDER_SITE_OTHER): Payer: Self-pay | Admitting: Cardiology

## 2020-07-04 ENCOUNTER — Encounter: Payer: Self-pay | Admitting: Cardiology

## 2020-07-04 VITALS — BP 134/67 | HR 70 | Ht 70.0 in | Wt 214.8 lb

## 2020-07-04 DIAGNOSIS — I10 Essential (primary) hypertension: Secondary | ICD-10-CM

## 2020-07-04 DIAGNOSIS — R002 Palpitations: Secondary | ICD-10-CM

## 2020-07-04 DIAGNOSIS — I2510 Atherosclerotic heart disease of native coronary artery without angina pectoris: Secondary | ICD-10-CM

## 2020-07-04 DIAGNOSIS — E7800 Pure hypercholesterolemia, unspecified: Secondary | ICD-10-CM

## 2020-07-04 NOTE — Patient Instructions (Signed)
Medication Instructions:  Continue same medications *If you need a refill on your cardiac medications before your next appointment, please call your pharmacy*   Lab Work: None ordered   Testing/Procedures: 30 day event monitor   Follow-Up: At [facility], you and your health needs are our priority.  As part of our continuing mission to provide you with exceptional heart care, we have created designated Provider Care Teams.  These Care Teams include your primary Cardiologist (physician) and Advanced Practice Providers (APPs -  Physician Assistants and Nurse Practitioners) who all work together to provide you with the care you need, when you need it.  We recommend signing up for the patient portal called "MyChart".  Sign up information is provided on this After Visit Summary.  MyChart is used to connect with patients for Virtual Visits (Telemedicine).  Patients are able to view lab/test results, encounter notes, upcoming appointments, etc.  Non-urgent messages can be sent to your provider as well.   To learn more about what you can do with MyChart, go to [email or web address].    Your next appointment:  Will be determined after monitor   The format for your next appointment: Office     Provider:  Dr.Jordan

## 2020-07-04 NOTE — Progress Notes (Signed)
Patrick Berg Date of Birth: 08-Mar-1956 Medical Record #[id number]  History of Present Illness: Patrick Berg is seen for evaluation of palpitations.  He is status post  MI/BMS to the LAD and BMS to the LCX in April 2012. Follow up stress Myoview study in December 2013 showed normal perfusion with an ejection fraction of 51%. ETT done July 2020 was normal.  He was seen in April and was doing well. Since then he has developed symptoms of palpitations. He notes a bump in his chest with pressure in his throat and a feel of a pause. This is most noticeable when lying down or sitting. May happen 3-4 times a minute. No syncope or dizziness. A little lightheaded. No racing. Seems to get better with taking potassium supplement.    Current Outpatient Medications on File Prior to Visit  Medication Sig Dispense Refill  . acetaminophen (TYLENOL) 500 MG tablet Take 1,000 mg by mouth every 6 (six) hours as needed. 2 tablets     . amLODipine (NORVASC) 5 MG tablet Take 1 tablet (5 mg total) by mouth daily. 90 tablet 3  . aspirin 81 MG EC tablet Take 81 mg by mouth daily.      . Coenzyme Q10 (CO Q 10) 100 MG CAPS Take 1 capsule by mouth daily.    Marland Kitchen ezetimibe (ZETIA) 10 MG tablet Take 1 tablet (10 mg total) by mouth daily. 90 tablet 3  . multivitamin (THERAGRAN) per tablet Take 1 tablet by mouth daily.      . nitroGLYCERIN (NITROSTAT) 0.4 MG SL tablet Place 1 tablet (0.4 mg total) under the tongue every 5 (five) minutes as needed. 25 tablet 11  . rosuvastatin (CRESTOR) 10 MG tablet Take 1 tablet (10 mg total) by mouth daily. 90 tablet 3   No current facility-administered medications on file prior to visit.    Allergies  Allergen Reactions  . Ace Inhibitors     cough  . Irbesartan Other (See Comments)    Insomnia, nervousness  . Lipitor [Atorvastatin] Other (See Comments)    SEVERE LEG PAIN  . Naproxen Nausea And Vomiting    Past Medical History:  Diagnosis Date  . Arthralgia   . Bradycardia     intolerant of bradycardia  . Coronary artery disease   . History of acute anterior wall MI 4/12   BMS to the LAD  . Hyperlipidemia   . Myocardial infarction, anterior wall, subsequent care   . S/P coronary artery stent placement 4/12   BMS to the mid LCX  . Ventricular dysfunction    left (mild); EF is 40% per cath 4/12; improved to 55% per echo in July 2012    Past Surgical History:  Procedure Laterality Date  . CORONARY STENT PLACEMENT  02/17/2011   BMS to the LAD  . CORONARY STENT PLACEMENT  03/15/11   BMS to the mid LCX  . VASECTOMY      Social History   Tobacco Use  Smoking Status Never Smoker  Smokeless Tobacco Never Used    Social History   Substance and Sexual Activity  Alcohol Use No  . Alcohol/week: 0.0 standard drinks    Family History  Problem Relation Age of Onset  . Anxiety disorder Father   . Heart attack Father   . Heart failure Father   . Anemia Mother   . Cancer Mother        liver / lung  . Anxiety disorder Mother   . Hyperlipidemia Mother  Review of Systems: The review of systems is per the HPI.  All other systems were reviewed and are negative.  Physical Exam: BP 134/67   Pulse 70   Ht 5\' 10"  (1.778 m)   Wt 214 lb 12.8 oz (97.4 kg)   SpO2 98%   BMI 30.82 kg/m  GENERAL:  Well appearing  WM in NAD HEENT:  PERRL, EOMI, sclera are clear. Oropharynx is clear. NECK:  No jugular venous distention, carotid upstroke brisk and symmetric, no bruits, no thyromegaly or adenopathy LUNGS:  Clear to auscultation bilaterally CHEST:  Unremarkable HEART:  RRR,  PMI not displaced or sustained,S1 and S2 within normal limits, no S3, no S4: no clicks, no rubs, no murmurs ABD:  Soft, nontender. BS +, no masses or bruits. No hepatomegaly, no splenomegaly EXT:  2 + pulses throughout, no edema, no cyanosis no clubbing SKIN:  Warm and dry.  No rashes NEURO:  Alert and oriented x 3. Cranial nerves II through XII intact. PSYCH:  Cognitively  intact   LABORATORY DATA:  Echo: 11/30/15: Study Conclusions  - Left ventricle: The cavity size was normal. There was mild focal   basal hypertrophy of the septum. Systolic function was normal.   The estimated ejection fraction was in the range of 55% to 60%.   There is hypokinesis of the septal myocardium. Doppler parameters   are consistent with abnormal left ventricular relaxation (grade 1   diastolic dysfunction). - Mitral valve: Calcified annulus.  Impressions:  - Septal hypokinesis noted on apical four chamber view; overall   preserved LV function; grade 1 diastolic dysfunction.   Lab Results  Component Value Date   WBC 7.1 03/16/2011   HGB 13.7 03/16/2011   HCT 40.1 03/16/2011   PLT 180 03/16/2011   GLUCOSE 92 03/13/2020   CHOL 150 03/13/2020   TRIG 167 (H) 03/13/2020   HDL 47 03/13/2020   LDLCALC 75 03/13/2020   ALT 21 03/13/2020   AST 24 03/13/2020   NA 139 03/13/2020   K 4.6 03/13/2020   CL 104 03/13/2020   CREATININE 0.72 (L) 03/13/2020   BUN 13 03/13/2020   CO2 23 03/13/2020   TSH 2.113 02/17/2011   INR 1.2 (H) 03/07/2011   HGBA1C  02/17/2011    5.3 (NOTE)                                                                       According to the ADA Clinical Practice Recommendations for 2011, when HbA1c is used as a screening test:   >=6.5%   Diagnostic of Diabetes Mellitus           (if abnormal result  is confirmed)  5.7-6.4%   Increased risk of developing Diabetes Mellitus  References:Diagnosis and Classification of Diabetes Mellitus,Diabetes Care,2011,34(Suppl 1):S62-S69 and Standards of Medical Care in         Diabetes - 2011,Diabetes Care,2011,34  (Suppl 1):S11-S61.   Ecg today shows NSR rate 70. Normal Ecg.   I have personally reviewed and interpreted this study.  ETT 07/04/17: Study Highlights    Blood pressure demonstrated a normal response to exercise.  There was no ST segment deviation noted during stress.  No T wave inversion was noted  during stress.  Excellent exercise capacity.  Negative, adequate stress test.     ETT 05/28/19: Study Highlights   Blood pressure demonstrated a normal response to exercise.  Upsloping ST segment depression was noted during stress in the II, III, aVF, V5 and V6 leads, and returning to baseline after 1-5 minutes of recovery.  Excellent exercise capacity.  Normal adequate stress test.     Assessment / Plan: 1. Coronary disease status post myocardial infarction with subsequent stenting of the LAD and left circumflex coronary in 2012 with bare-metal stents. Normal Myoview study in December 2013. Normal ETT in May 2016,  August 2018, and July 2020.  We will continue with aspirin and statin therapy.. 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   2. Dyslipidemia-on Crestor and Zetia. Satisfactory.   3. HTN- controlled on amlodipine  4. Palpitations .suspect mor PVCs or PACs. Will have him wear an event monitor. If he has a high burden would consider a beta blocker.

## 2020-07-05 ENCOUNTER — Telehealth: Payer: Self-pay | Admitting: *Deleted

## 2020-07-05 NOTE — Telephone Encounter (Signed)
Patient called regarding instructions to apply for $225.00 discounted self pay price for [employer] cardiac event monitor.  Information texted to patient per his request.  Patient enrolled for [employer] to ship a 30 day cardiac event monitor to his home.

## 2020-07-17 ENCOUNTER — Ambulatory Visit (INDEPENDENT_AMBULATORY_CARE_PROVIDER_SITE_OTHER): Payer: Self-pay

## 2020-07-17 DIAGNOSIS — R002 Palpitations: Secondary | ICD-10-CM

## 2020-08-24 ENCOUNTER — Other Ambulatory Visit: Payer: Self-pay

## 2020-08-24 MED ORDER — METOPROLOL SUCCINATE ER 25 MG PO TB24: 25.0000 mg | ORAL_TABLET | Freq: Every day | ORAL | 3 refills | Status: DC

## 2020-08-24 NOTE — Progress Notes (Signed)
toprol 

## 2021-01-25 ENCOUNTER — Telehealth: Payer: Self-pay | Admitting: Cardiology

## 2021-01-25 NOTE — Telephone Encounter (Signed)
Left message to call back  

## 2021-01-25 NOTE — Telephone Encounter (Signed)
New Message:     Pt is scheduled to see Dr Swaziland on 03-19-21. He wants to get his lab work the same day, he would like for you to send an order for his lab work please.

## 2021-01-29 NOTE — Telephone Encounter (Signed)
Attempted to contact patient, LVM to call back- but did advise Patrick Berg he can do the labs same day as his office visit with no need for pre orders. Unless he wanted lab work drawn before appointment, and to let us know.  Left call back number.

## 2021-03-15 NOTE — Progress Notes (Signed)
Patrick Berg Date of Birth: [birth year] Medical Record #[id number]  History of Present Illness: Patrick Berg is seen for follow up of palpitations and CAD.  He is status post  MI/BMS to the LAD and BMS to the LCX in April 2012. Follow up stress Myoview study in December 2013 showed normal perfusion with an ejection fraction of 51%. ETT done July 2020 was normal.  He was seen in April 2021 and was doing well. In August 2021 developed symptoms of palpitations. He notes a bump in his chest with pressure in his throat and a feel of a pause. This is most noticeable when lying down or sitting. May happen 3-4 times a minute. No syncope or dizziness. A little lightheaded. No racing. Seems to get better with taking potassium supplement. We did have him wear an event monitor which showed infrequent PACs and PVCs.   He was started on metoprolol which has helped but does still have palpitations especially when stressed. He notes when he takes metoprolol in the am he can't function due to fatigue so takes it in the evening. No chest pain. Work is very physical and he does a lot of walking.    Current Outpatient Medications on File Prior to Visit  Medication Sig Dispense Refill  . acetaminophen (TYLENOL) 500 MG tablet Take 1,000 mg by mouth every 6 (six) hours as needed. 2 tablets     . amLODipine (NORVASC) 5 MG tablet Take 1 tablet (5 mg total) by mouth daily. 90 tablet 3  . aspirin 81 MG EC tablet Take 81 mg by mouth daily.      . Coenzyme Q10 (CO Q 10) 100 MG CAPS Take 1 capsule by mouth daily.    Marland Kitchen ezetimibe (ZETIA) 10 MG tablet Take 1 tablet (10 mg total) by mouth daily. 90 tablet 3  . metoprolol succinate (TOPROL XL) 25 MG 24 hr tablet Take 1 tablet (25 mg total) by mouth daily. 90 tablet 3  . multivitamin (THERAGRAN) per tablet Take 1 tablet by mouth daily.      . nitroGLYCERIN (NITROSTAT) 0.4 MG SL tablet Place 1 tablet (0.4 mg total) under the tongue every 5 (five) minutes as needed. 25 tablet 11  .  rosuvastatin (CRESTOR) 10 MG tablet Take 1 tablet (10 mg total) by mouth daily. 90 tablet 3   No current facility-administered medications on file prior to visit.    Allergies  Allergen Reactions  . Ace Inhibitors     cough  . Irbesartan Other (See Comments)    Insomnia, nervousness  . Lipitor [Atorvastatin] Other (See Comments)    SEVERE LEG PAIN  . Naproxen Nausea And Vomiting    Past Medical History:  Diagnosis Date  . Arthralgia   . Bradycardia    intolerant of bradycardia  . Coronary artery disease   . History of acute anterior wall MI 4/12   BMS to the LAD  . Hyperlipidemia   . Myocardial infarction, anterior wall, subsequent care   . S/P coronary artery stent placement 4/12   BMS to the mid LCX  . Ventricular dysfunction    left (mild); EF is 40% per cath 4/12; improved to 55% per echo in July 2012    Past Surgical History:  Procedure Laterality Date  . CORONARY STENT PLACEMENT  02/17/2011   BMS to the LAD  . CORONARY STENT PLACEMENT  03/15/11   BMS to the mid LCX  . VASECTOMY      Social History   Tobacco  Use  Smoking Status Never Smoker  Smokeless Tobacco Never Used    Social History   Substance and Sexual Activity  Alcohol Use No  . Alcohol/week: 0.0 standard drinks    Family History  Problem Relation Age of Onset  . Anxiety disorder Father   . Heart attack Father   . Heart failure Father   . Anemia Mother   . Cancer Mother        liver / lung  . Anxiety disorder Mother   . Hyperlipidemia Mother     Review of Systems: The review of systems is per the HPI.  All other systems were reviewed and are negative.  Physical Exam: BP 140/80 (BP Location: Right Arm, Cuff Size: Normal)   Pulse 64   Ht 5\' 10"  (1.778 m)   Wt 211 lb 9.6 oz (96 kg)   BMI 30.36 kg/m  GENERAL:  Well appearing  WM in NAD HEENT:  PERRL, EOMI, sclera are clear. Oropharynx is clear. NECK:  No jugular venous distention, carotid upstroke brisk and symmetric, no bruits,  no thyromegaly or adenopathy LUNGS:  Clear to auscultation bilaterally CHEST:  Unremarkable HEART:  RRR,  PMI not displaced or sustained,S1 and S2 within normal limits, no S3, no S4: no clicks, no rubs, no murmurs ABD:  Soft, nontender. BS +, no masses or bruits. No hepatomegaly, no splenomegaly EXT:  2 + pulses throughout, no edema, no cyanosis no clubbing SKIN:  Warm and dry.  No rashes NEURO:  Alert and oriented x 3. Cranial nerves II through XII intact. PSYCH:  Cognitively intact   LABORATORY DATA:  Echo: 11/30/15: Study Conclusions  - Left ventricle: The cavity size was normal. There was mild focal   basal hypertrophy of the septum. Systolic function was normal.   The estimated ejection fraction was in the range of 55% to 60%.   There is hypokinesis of the septal myocardium. Doppler parameters   are consistent with abnormal left ventricular relaxation (grade 1   diastolic dysfunction). - Mitral valve: Calcified annulus.  Impressions:  - Septal hypokinesis noted on apical four chamber view; overall   preserved LV function; grade 1 diastolic dysfunction.   Lab Results  Component Value Date   WBC 7.1 03/16/2011   HGB 13.7 03/16/2011   HCT 40.1 03/16/2011   PLT 180 03/16/2011   GLUCOSE 92 03/13/2020   CHOL 150 03/13/2020   TRIG 167 (H) 03/13/2020   HDL 47 03/13/2020   LDLCALC 75 03/13/2020   ALT 21 03/13/2020   AST 24 03/13/2020   NA 139 03/13/2020   K 4.6 03/13/2020   CL 104 03/13/2020   CREATININE 0.72 (L) 03/13/2020   BUN 13 03/13/2020   CO2 23 03/13/2020   TSH 2.113 02/17/2011   INR 1.2 (H) 03/07/2011   HGBA1C  02/17/2011    5.3 (NOTE)                                                                       According to the ADA Clinical Practice Recommendations for 2011, when HbA1c is used as a screening test:   >=6.5%   Diagnostic of Diabetes Mellitus           (if abnormal result  is confirmed)  5.7-6.4%   Increased risk of developing Diabetes Mellitus   References:Diagnosis and Classification of Diabetes Mellitus,Diabetes Care,2011,34(Suppl 1):S62-S69 and Standards of Medical Care in         Diabetes - 2011,Diabetes Care,2011,34  (Suppl 1):S11-S61.   Ecg today shows NSR rate 70. Normal Ecg.   I have personally reviewed and interpreted this study.  ETT 07/04/17: Study Highlights    Blood pressure demonstrated a normal response to exercise.  There was no ST segment deviation noted during stress.  No T wave inversion was noted during stress.  Excellent exercise capacity.  Negative, adequate stress test.     ETT 05/28/19: Study Highlights   Blood pressure demonstrated a normal response to exercise.  Upsloping ST segment depression was noted during stress in the II, III, aVF, V5 and V6 leads, and returning to baseline after 1-5 minutes of recovery.  Excellent exercise capacity.  Normal adequate stress test.   Event monitor 08/24/20: Study Highlights   Normal sinus rhythm  Rare PACs  Rare isolated PVCs  One 5 beat run NSVT  Symptoms correlate with PACs and PVCs.    Assessment / Plan: 1. Coronary disease status post myocardial infarction with subsequent stenting of the LAD and left circumflex coronary in 2012 with bare-metal stents. Normal Myoview study in December 2013. Normal ETT in May 2016,  August 2018, and July 2020.  We will continue with aspirin and statin therapy.  2. Dyslipidemia-on Crestor and Zetia. Follow up fasting labs drawn today  3. HTN- controlled on amlodipine, metoprolol  4. Palpitations secondary to  PVCs or PACs. Infrequent by event monitor. Will continue low dose Toprol XL.   Follow up in one year

## 2021-03-16 ENCOUNTER — Telehealth: Payer: Self-pay | Admitting: Cardiology

## 2021-03-16 ENCOUNTER — Telehealth: Payer: Self-pay

## 2021-03-16 MED ORDER — EZETIMIBE 10 MG PO TABS: 10.0000 mg | ORAL_TABLET | Freq: Every day | ORAL | 3 refills | Status: DC

## 2021-03-16 NOTE — Telephone Encounter (Signed)
Received a call from patient stated he will have fasting lab this Monday.Advised orders already placed.Stated he needed a zetia refill.Refill sent to pharmacy.

## 2021-03-16 NOTE — Telephone Encounter (Signed)
Called patient left message on personal voice mail orders are placed for a bmet.lipid and hepatic panels.

## 2021-03-16 NOTE — Telephone Encounter (Signed)
New Message:    Pt is having his lab work on [place] when he comes in for his appt. He wants to make sure that the order will be sent please.

## 2021-03-19 ENCOUNTER — Ambulatory Visit (INDEPENDENT_AMBULATORY_CARE_PROVIDER_SITE_OTHER): Payer: Self-pay | Admitting: Cardiology

## 2021-03-19 ENCOUNTER — Other Ambulatory Visit: Payer: Self-pay

## 2021-03-19 ENCOUNTER — Encounter: Payer: Self-pay | Admitting: Cardiology

## 2021-03-19 VITALS — BP 140/80 | HR 64 | Ht 70.0 in | Wt 211.6 lb

## 2021-03-19 DIAGNOSIS — I10 Essential (primary) hypertension: Secondary | ICD-10-CM

## 2021-03-19 DIAGNOSIS — R002 Palpitations: Secondary | ICD-10-CM

## 2021-03-19 DIAGNOSIS — I2510 Atherosclerotic heart disease of native coronary artery without angina pectoris: Secondary | ICD-10-CM

## 2021-03-19 DIAGNOSIS — E7800 Pure hypercholesterolemia, unspecified: Secondary | ICD-10-CM

## 2021-03-19 LAB — BASIC METABOLIC PANEL
BUN/Creatinine Ratio: 19 (ref 10–24)
BUN: 15 mg/dL (ref 8–27)
CO2: 24 mmol/L (ref 20–29)
Calcium: 9.7 mg/dL (ref 8.6–10.2)
Chloride: 101 mmol/L (ref 96–106)
Creatinine, Ser: 0.77 mg/dL (ref 0.76–1.27)
Glucose: 95 mg/dL (ref 65–99)
Potassium: 4.8 mmol/L (ref 3.5–5.2)
Sodium: 139 mmol/L (ref 134–144)
eGFR: 99 mL/min/{1.73_m2} (ref >59)

## 2021-03-19 LAB — HEPATIC FUNCTION PANEL
ALT: 20 IU/L (ref 0–44)
AST: 17 IU/L (ref 0–40)
Albumin: 4.4 g/dL (ref 3.8–4.8)
Alkaline Phosphatase: 72 IU/L (ref 44–121)
Bilirubin Total: 1 mg/dL (ref 0.0–1.2)
Bilirubin, Direct: 0.22 mg/dL (ref 0.00–0.40)
Total Protein: 7.1 g/dL (ref 6.0–8.5)

## 2021-03-19 LAB — LIPID PANEL
Chol/HDL Ratio: 4.1 ratio (ref 0.0–5.0)
Cholesterol, Total: 158 mg/dL (ref 100–199)
HDL: 39 mg/dL — ABNORMAL LOW (ref >39)
LDL Chol Calc (NIH): 87 mg/dL (ref 0–99)
Triglycerides: 190 mg/dL — ABNORMAL HIGH (ref 0–149)
VLDL Cholesterol Cal: 32 mg/dL (ref 5–40)

## 2021-03-20 ENCOUNTER — Telehealth: Payer: Self-pay | Admitting: Cardiology

## 2021-03-20 ENCOUNTER — Other Ambulatory Visit: Payer: Self-pay

## 2021-03-20 DIAGNOSIS — I2510 Atherosclerotic heart disease of native coronary artery without angina pectoris: Secondary | ICD-10-CM

## 2021-03-20 DIAGNOSIS — E7800 Pure hypercholesterolemia, unspecified: Secondary | ICD-10-CM

## 2021-03-20 MED ORDER — ROSUVASTATIN CALCIUM 20 MG PO TABS: 20.0000 mg | ORAL_TABLET | Freq: Every day | ORAL | 3 refills | Status: DC

## 2021-03-20 NOTE — Telephone Encounter (Signed)
Spoke to patient lab results given. 

## 2021-03-20 NOTE — Telephone Encounter (Signed)
Patrick Berg is returning [name]'s call in regards to his lab results. Please advise.

## 2021-03-20 NOTE — Telephone Encounter (Signed)
Called patient, attempted to notify patient of results, but he did not answer-  Will route to primary nurse to make aware.  Left message to call back.

## 2021-06-14 ENCOUNTER — Other Ambulatory Visit: Payer: Self-pay | Admitting: Cardiology

## 2021-06-22 LAB — LIPID PANEL
Chol/HDL Ratio: 3.6 ratio (ref 0.0–5.0)
Cholesterol, Total: 147 mg/dL (ref 100–199)
HDL: 41 mg/dL (ref >39)
LDL Chol Calc (NIH): 85 mg/dL (ref 0–99)
Triglycerides: 116 mg/dL (ref 0–149)
VLDL Cholesterol Cal: 21 mg/dL (ref 5–40)

## 2021-06-22 LAB — HEPATIC FUNCTION PANEL
ALT: 22 IU/L (ref 0–44)
AST: 23 IU/L (ref 0–40)
Albumin: 4.5 g/dL (ref 3.8–4.8)
Alkaline Phosphatase: 64 IU/L (ref 44–121)
Bilirubin Total: 1.3 mg/dL — ABNORMAL HIGH (ref 0.0–1.2)
Bilirubin, Direct: 0.24 mg/dL (ref 0.00–0.40)
Total Protein: 7.1 g/dL (ref 6.0–8.5)

## 2021-06-27 ENCOUNTER — Other Ambulatory Visit: Payer: Self-pay

## 2021-06-27 ENCOUNTER — Telehealth: Payer: Self-pay | Admitting: Cardiology

## 2021-06-27 DIAGNOSIS — E7800 Pure hypercholesterolemia, unspecified: Secondary | ICD-10-CM

## 2021-06-27 DIAGNOSIS — I2510 Atherosclerotic heart disease of native coronary artery without angina pectoris: Secondary | ICD-10-CM

## 2021-06-27 MED ORDER — ROSUVASTATIN CALCIUM 20 MG PO TABS: ORAL_TABLET | ORAL | 3 refills | Status: DC

## 2021-06-27 NOTE — Telephone Encounter (Signed)
Pt returning phone call... please advise  

## 2021-06-27 NOTE — Telephone Encounter (Signed)
Already spoke to patient lab results given. 

## 2021-06-27 NOTE — Progress Notes (Signed)
pk

## 2021-08-17 ENCOUNTER — Other Ambulatory Visit: Payer: Self-pay | Admitting: Cardiology

## 2021-09-28 LAB — LIPID PANEL
Chol/HDL Ratio: 3.1 ratio (ref 0.0–5.0)
Cholesterol, Total: 128 mg/dL (ref 100–199)
HDL: 41 mg/dL (ref >39)
LDL Chol Calc (NIH): 70 mg/dL (ref 0–99)
Triglycerides: 90 mg/dL (ref 0–149)
VLDL Cholesterol Cal: 17 mg/dL (ref 5–40)

## 2021-09-28 LAB — HEPATIC FUNCTION PANEL
ALT: 30 IU/L (ref 0–44)
AST: 30 IU/L (ref 0–40)
Albumin: 4.5 g/dL (ref 3.8–4.8)
Alkaline Phosphatase: 70 IU/L (ref 44–121)
Bilirubin Total: 1.4 mg/dL — ABNORMAL HIGH (ref 0.0–1.2)
Bilirubin, Direct: 0.3 mg/dL (ref 0.00–0.40)
Total Protein: 7.1 g/dL (ref 6.0–8.5)

## 2021-09-28 LAB — CK: Total CK: 156 U/L (ref 41–331)

## 2021-10-01 ENCOUNTER — Telehealth: Payer: Self-pay | Admitting: Cardiology

## 2021-10-01 NOTE — Telephone Encounter (Signed)
Patient wanted to discuss recent lab results with Elnita Maxwell

## 2021-10-01 NOTE — Telephone Encounter (Signed)
Attempted to contact pt. Unable to leave message as mailbox is full.  °

## 2021-10-02 ENCOUNTER — Telehealth: Payer: Self-pay

## 2021-10-02 NOTE — Telephone Encounter (Signed)
Reduce Crestor to 20 mg and see if this improves  Fiore Detjen Swaziland MD, [facility]

## 2021-10-02 NOTE — Telephone Encounter (Signed)
Called patient no answer.Unable to leave a message voice mail full. 

## 2021-10-02 NOTE — Telephone Encounter (Signed)
Returned call to patient no answer.Unable to leave a message voicemail full. °

## 2021-10-02 NOTE — Telephone Encounter (Signed)
Pt is returning call.  

## 2021-10-02 NOTE — Telephone Encounter (Signed)
Lab results given to patient.He stated he is having a lot of leg cramps taking Crestor 40 mg daily.Advised I will send message to Dr.Jordan for advice.

## 2021-10-05 MED ORDER — ROSUVASTATIN CALCIUM 20 MG PO TABS: 20.0000 mg | ORAL_TABLET | Freq: Every day | ORAL | 3 refills | Status: DC

## 2021-10-05 NOTE — Telephone Encounter (Signed)
Called patient no answer.Unable to leave a message mailbox is full.

## 2021-10-05 NOTE — Telephone Encounter (Signed)
Charna Elizabeth, LPN  [phone]/12/6376  2:01 PM EST Back to Top    Spoke to patient results given.

## 2021-10-05 NOTE — Telephone Encounter (Signed)
Received a call back from patient.Dr.Jordan's advice given.Advised to call back if he has cramping taking Crestor 20 mg.

## 2022-03-18 ENCOUNTER — Other Ambulatory Visit: Payer: Self-pay | Admitting: Cardiology

## 2022-03-19 ENCOUNTER — Telehealth: Payer: Self-pay | Admitting: Cardiology

## 2022-03-19 ENCOUNTER — Other Ambulatory Visit: Payer: Self-pay

## 2022-03-19 DIAGNOSIS — I10 Essential (primary) hypertension: Secondary | ICD-10-CM

## 2022-03-19 DIAGNOSIS — E7800 Pure hypercholesterolemia, unspecified: Secondary | ICD-10-CM

## 2022-03-19 DIAGNOSIS — I2510 Atherosclerotic heart disease of native coronary artery without angina pectoris: Secondary | ICD-10-CM

## 2022-03-19 NOTE — Telephone Encounter (Signed)
Pt would like for nurse to call him regarding appt and lab work. Please advise ?

## 2022-03-19 NOTE — Telephone Encounter (Signed)
Spoke to patient follow up appointment scheduled with Dr.Jordan 04/12/22 at 8:00 am.Advised he can have fasting lab 1 week before appointment.Lab orders mailed. ?

## 2022-04-05 LAB — BASIC METABOLIC PANEL
BUN/Creatinine Ratio: 22 (ref 10–24)
BUN: 16 mg/dL (ref 8–27)
CO2: 23 mmol/L (ref 20–29)
Calcium: 9.5 mg/dL (ref 8.6–10.2)
Chloride: 106 mmol/L (ref 96–106)
Creatinine, Ser: 0.73 mg/dL — ABNORMAL LOW (ref 0.76–1.27)
Glucose: 119 mg/dL — ABNORMAL HIGH (ref 70–99)
Potassium: 4.5 mmol/L (ref 3.5–5.2)
Sodium: 141 mmol/L (ref 134–144)
eGFR: 100 mL/min/{1.73_m2} (ref >59)

## 2022-04-05 LAB — LIPID PANEL
Chol/HDL Ratio: 3.5 ratio (ref 0.0–5.0)
Cholesterol, Total: 145 mg/dL (ref 100–199)
HDL: 41 mg/dL (ref >39)
LDL Chol Calc (NIH): 82 mg/dL (ref 0–99)
Triglycerides: 125 mg/dL (ref 0–149)
VLDL Cholesterol Cal: 22 mg/dL (ref 5–40)

## 2022-04-05 LAB — HEPATIC FUNCTION PANEL
ALT: 26 IU/L (ref 0–44)
AST: 27 IU/L (ref 0–40)
Albumin: 4.5 g/dL (ref 3.8–4.8)
Alkaline Phosphatase: 71 IU/L (ref 44–121)
Bilirubin Total: 1.3 mg/dL — ABNORMAL HIGH (ref 0.0–1.2)
Bilirubin, Direct: 0.34 mg/dL (ref 0.00–0.40)
Total Protein: 6.6 g/dL (ref 6.0–8.5)

## 2022-04-05 NOTE — Progress Notes (Signed)
Patrick Berg Date of Birth: 04-06-1956 Medical Record #[id number]  History of Present Illness: [name] is seen for follow up of palpitations and CAD.  He is status post  MI/BMS to the LAD and BMS to the LCX in April 2012. Follow up stress Myoview study in December 2013 showed normal perfusion with an ejection fraction of 51%. ETT done July 2020 was normal.  He was seen in April 2021 and was doing well. In August 2021 developed symptoms of palpitations. He notes a bump in his chest with pressure in his throat and a feel of a pause. This is most noticeable when lying down or sitting. May happen 3-4 times a minute. No syncope or dizziness. A little lightheaded. No racing. Seems to get better with taking potassium supplement. We did have him wear an event monitor which showed infrequent PACs and PVCs.   He was started on metoprolol which helped but does still have palpitations especially when stressed.  On follow up today he feels well. Still working and does a lot of walking. Has a gimpy knee now.  No chest pain. Is trying to get on Medicare. Doesn't have insurance currently. No dyspnea.    Current Outpatient Medications on File Prior to Visit  Medication Sig Dispense Refill   acetaminophen (TYLENOL) 500 MG tablet Take 1,000 mg by mouth every 6 (six) hours as needed. 2 tablets      amLODipine (NORVASC) 5 MG tablet Take 1 tablet by mouth once daily 90 tablet 3   aspirin 81 MG EC tablet Take 81 mg by mouth daily.       Coenzyme Q10 (CO Q 10) 100 MG CAPS Take 1 capsule by mouth daily.     ezetimibe (ZETIA) 10 MG tablet Take 1 tablet (10 mg total) by mouth daily. PATIENT MUST SCHEDULE ANNUAL APPOINTMENT FOR FUTURE REFILLS. 1ST ATTEMPT 30 tablet 0   metoprolol succinate (TOPROL-XL) 25 MG 24 hr tablet TAKE 1 TABLET BY MOUTH EVERY DAY 90 tablet 3   multivitamin (THERAGRAN) per tablet Take 1 tablet by mouth daily.       nitroGLYCERIN (NITROSTAT) 0.4 MG SL tablet Place 1 tablet (0.4 mg total) under  the tongue every 5 (five) minutes as needed. 25 tablet 11   rosuvastatin (CRESTOR) 20 MG tablet Take 1 tablet (20 mg total) by mouth daily. 90 tablet 3   No current facility-administered medications on file prior to visit.    Allergies  Allergen Reactions   Ace Inhibitors     cough   Irbesartan Other (See Comments)    Insomnia, nervousness   Lipitor [Atorvastatin] Other (See Comments)    SEVERE LEG PAIN   Naproxen Nausea And Vomiting    Past Medical History:  Diagnosis Date   Arthralgia    Bradycardia    intolerant of bradycardia   Coronary artery disease    History of acute anterior wall MI 02/17/2011   BMS to the LAD   Hyperlipidemia    Myocardial infarction, anterior wall, subsequent care    S/P coronary artery stent placement 02/17/2011   BMS to the mid LCX   Ventricular dysfunction    left (mild); EF is 40% per cath 4/12; improved to 55% per echo in July 2012    Past Surgical History:  Procedure Laterality Date   CORONARY STENT PLACEMENT  02/17/2011   BMS to the LAD   CORONARY STENT PLACEMENT  03/15/11   BMS to the mid LCX   VASECTOMY  Social History   Tobacco Use  Smoking Status Never  Smokeless Tobacco Never    Social History   Substance and Sexual Activity  Alcohol Use No   Alcohol/week: 0.0 standard drinks    Family History  Problem Relation Age of Onset   Anxiety disorder Father    Heart attack Father    Heart failure Father    Anemia Mother    Cancer Mother        liver / lung   Anxiety disorder Mother    Hyperlipidemia Mother     Review of Systems: The review of systems is per the HPI.  All other systems were reviewed and are negative.  Physical Exam: Ht 5\' 9"  (1.753 m)   BMI 31.25 kg/m  GENERAL:  Well appearing  WM in NAD HEENT:  PERRL, EOMI, sclera are clear. Oropharynx is clear. NECK:  No jugular venous distention, carotid upstroke brisk and symmetric, no bruits, no thyromegaly or adenopathy LUNGS:  Clear to auscultation  bilaterally CHEST:  Unremarkable HEART:  RRR,  PMI not displaced or sustained,S1 and S2 within normal limits, no S3, no S4: no clicks, no rubs, no murmurs ABD:  Soft, nontender. BS +, no masses or bruits. No hepatomegaly, no splenomegaly EXT:  2 + pulses throughout, no edema, no cyanosis no clubbing SKIN:  Warm and dry.  No rashes NEURO:  Alert and oriented x 3. Cranial nerves II through XII intact. PSYCH:  Cognitively intact   LABORATORY DATA:  Echo: 11/30/15: Study Conclusions   - Left ventricle: The cavity size was normal. There was mild focal   basal hypertrophy of the septum. Systolic function was normal.   The estimated ejection fraction was in the range of 55% to 60%.   There is hypokinesis of the septal myocardium. Doppler parameters   are consistent with abnormal left ventricular relaxation (grade 1   diastolic dysfunction). - Mitral valve: Calcified annulus.   Impressions:   - Septal hypokinesis noted on apical four chamber view; overall   preserved LV function; grade 1 diastolic dysfunction.   Lab Results  Component Value Date   WBC 7.1 03/16/2011   HGB 13.7 03/16/2011   HCT 40.1 03/16/2011   PLT 180 03/16/2011   GLUCOSE 119 (H) 04/05/2022   CHOL 145 04/05/2022   TRIG 125 04/05/2022   HDL 41 04/05/2022   LDLCALC 82 04/05/2022   ALT 26 04/05/2022   AST 27 04/05/2022   NA 141 04/05/2022   K 4.5 04/05/2022   CL 106 04/05/2022   CREATININE 0.73 (L) 04/05/2022   BUN 16 04/05/2022   CO2 23 04/05/2022   TSH 2.113 02/17/2011   INR 1.2 (H) 03/07/2011   HGBA1C  02/17/2011    5.3 (NOTE)                                                                       According to the ADA Clinical Practice Recommendations for 2011, when HbA1c is used as a screening test:   >=6.5%   Diagnostic of Diabetes Mellitus           (if abnormal result  is confirmed)  5.7-6.4%   Increased risk of developing Diabetes Mellitus  References:Diagnosis and Classification of Diabetes  [place],Diabetes  TDSK,8768,11(XBWIO 1):S62-S69 and Standards of Medical Care in         Diabetes - 2011,Diabetes Care,2011,34  (Suppl 1):S11-S61.   Ecg today shows NSR rate 70.compared to prior Ecg there is now poor R wave progression in leads V1-4.    I have personally reviewed and interpreted this study.  ETT 07/04/17: Study Highlights   Blood pressure demonstrated a normal response to exercise. There was no ST segment deviation noted during stress. No T wave inversion was noted during stress. Excellent exercise capacity. Negative, adequate stress test.     ETT 05/28/19: Study Highlights  Blood pressure demonstrated a normal response to exercise. Upsloping ST segment depression was noted during stress in the II, III, aVF, V5 and V6 leads, and returning to baseline after 1-5 minutes of recovery. Excellent exercise capacity. Normal adequate stress test.   Event monitor 08/24/20: Study Highlights  Normal sinus rhythm Rare PACs Rare isolated PVCs One 5 beat run NSVT Symptoms correlate with PACs and PVCs.    Assessment / Plan: 1. Coronary disease status post myocardial infarction with subsequent stenting of the LAD and left circumflex coronary in 2012 with bare-metal stents. Normal Myoview study in December 2013. Normal ETT in May 2016,  August 2018, and July 2020. He is asymptomatic. There are some new changes on Ecg with poor R wave progression. We discussed doing a follow up stress test. It would need to be a pharmacologic Nuclear study since his is unable to walk on a treadmill with his knee. This would be a significant expense since he has no insurance. Will refer to Social work to help apply for [employer].  We will continue with aspirin and statin therapy. Once he gets insurance we can arrange for stress testing.   2. Dyslipidemia-on Crestor and Zetia. LDL in low 80s. Unable to tolerate higher statin dose. Will continue Crestor 20 mg and Zetia. Focus more on healthy diet and weight  loss.   3. HTN- controlled on amlodipine, metoprolol  4. Palpitations secondary to  PVCs or PACs. Infrequent by event monitor. Will continue low dose Toprol XL.   Follow up in one year

## 2022-04-12 ENCOUNTER — Ambulatory Visit (INDEPENDENT_AMBULATORY_CARE_PROVIDER_SITE_OTHER): Payer: Self-pay | Admitting: Cardiology

## 2022-04-12 ENCOUNTER — Telehealth (HOSPITAL_COMMUNITY): Payer: Self-pay | Admitting: Licensed Clinical Social Worker

## 2022-04-12 ENCOUNTER — Encounter: Payer: Self-pay | Admitting: Cardiology

## 2022-04-12 VITALS — BP 134/78 | HR 65 | Ht 69.0 in | Wt 215.0 lb

## 2022-04-12 DIAGNOSIS — R002 Palpitations: Secondary | ICD-10-CM

## 2022-04-12 DIAGNOSIS — E7800 Pure hypercholesterolemia, unspecified: Secondary | ICD-10-CM

## 2022-04-12 DIAGNOSIS — I10 Essential (primary) hypertension: Secondary | ICD-10-CM

## 2022-04-12 DIAGNOSIS — I2510 Atherosclerotic heart disease of native coronary artery without angina pectoris: Secondary | ICD-10-CM

## 2022-04-12 MED ORDER — AMLODIPINE BESYLATE 5 MG PO TABS: 5.0000 mg | ORAL_TABLET | Freq: Every day | ORAL | 3 refills | Status: DC

## 2022-04-12 MED ORDER — METOPROLOL SUCCINATE ER 25 MG PO TB24: 25.0000 mg | ORAL_TABLET | Freq: Every day | ORAL | 3 refills | Status: DC

## 2022-04-12 MED ORDER — ROSUVASTATIN CALCIUM 20 MG PO TABS: 20.0000 mg | ORAL_TABLET | Freq: Every day | ORAL | 3 refills | Status: DC

## 2022-04-12 MED ORDER — EZETIMIBE 10 MG PO TABS: 10.0000 mg | ORAL_TABLET | Freq: Every day | ORAL | 3 refills | Status: DC

## 2022-04-12 NOTE — Patient Instructions (Signed)
Medication Instructions:  Your physician recommends that you continue on your current medications as directed. Please refer to the Current Medication list given to you today.  *If you need a refill on your cardiac medications before your next appointment, please call your pharmacy*   Follow-Up: At [facility], you and your health needs are our priority.  As part of our continuing mission to provide you with exceptional heart care, we have created designated Provider Care Teams.  These Care Teams include your primary Cardiologist (physician) and Advanced Practice Providers (APPs -  Physician Assistants and Nurse Practitioners) who all work together to provide you with the care you need, when you need it.  We recommend signing up for the patient portal called "MyChart".  Sign up information is provided on this After Visit Summary.  MyChart is used to connect with patients for Virtual Visits (Telemedicine).  Patients are able to view lab/test results, encounter notes, upcoming appointments, etc.  Non-urgent messages can be sent to your provider as well.   To learn more about what you can do with MyChart, go to [email or web address].    Your next appointment:   12 month(s)  The format for your next appointment:   In Person  Provider:   Peter Jordan, MD { 

## 2022-04-12 NOTE — Addendum Note (Signed)
Addended by: Bernita Buffy on: 04/12/2022 08:13 AM   Modules accepted: Orders

## 2022-04-12 NOTE — Telephone Encounter (Signed)
CSW received referral from [facility] office to assist patient with disability. CSW attempted to contact patient with no answer and mailbox full. Lasandra Beech, LCSW, CCSW-MCS [phone]

## 2022-04-16 ENCOUNTER — Telehealth: Payer: Self-pay | Admitting: Licensed Clinical Social Worker

## 2022-04-16 NOTE — Telephone Encounter (Signed)
CSW received request to contact patient for assistance with Medicare. CSW spoke with patient who states he has tried to apply for Medicare although the first time he tried online and was ultimately locked out because of the verifications and he only has his phone to use to apply. Patient stated that the second time he made a phone appointment and was at work and missed the call and unable to return call. Patient shared frustrations with inability to use his phone to complete online because it is too small and complicated and due to work makes it complicated to follow up with the phone interview. CSW encouraged patient to contact local [place] office and request an appointment for in person or if only phone available to possibly step outside during work to take the call. Patient frustrated with process although CSW shared the only two ways is online or contacting [employer] directly. Patient verbalizes understanding of process and states he will attempt again. CSW available as needed. Raquel Sarna, [place], [place]

## 2022-08-11 ENCOUNTER — Other Ambulatory Visit: Payer: Self-pay | Admitting: Cardiology

## 2023-08-08 ENCOUNTER — Other Ambulatory Visit: Payer: Self-pay | Admitting: Cardiology

## 2023-09-15 ENCOUNTER — Telehealth: Payer: Self-pay | Admitting: Cardiology

## 2023-09-15 DIAGNOSIS — I10 Essential (primary) hypertension: Secondary | ICD-10-CM

## 2023-09-15 MED ORDER — AMLODIPINE BESYLATE 5 MG PO TABS: 5.0000 mg | ORAL_TABLET | Freq: Every day | ORAL | 0 refills | Status: DC

## 2023-09-15 MED ORDER — EZETIMIBE 10 MG PO TABS: 10.0000 mg | ORAL_TABLET | Freq: Every day | ORAL | 0 refills | Status: DC

## 2023-09-15 NOTE — Telephone Encounter (Signed)
Spoke to patient who stated he hasn't been in to be seen due to insurance issues. He currently has insurance and want go be seen. He is asking for refill on Amlodipine and Zetia. Appointment scheduled for 11/5  with Dr Swaziland. Refilled medication for 30 days. He also would like blood work prior to his appointment. Please advise on labs.

## 2023-09-15 NOTE — Telephone Encounter (Signed)
Patient would speak to Dr. Elvis Coil nurse Elnita Maxwell.

## 2023-09-17 NOTE — Telephone Encounter (Signed)
Patient is returning call. Please advise? 

## 2023-09-17 NOTE — Telephone Encounter (Signed)
Pt is calling to f/u on getting a callback from nurse [place]. Please advise

## 2023-09-17 NOTE — Telephone Encounter (Signed)
Mailbox full

## 2023-09-17 NOTE — Telephone Encounter (Signed)
Spoke with patient and he is aware we are waiting to see what labs Dr. Swaziland would like to order. Once we have labs we will give him a call.

## 2023-09-17 NOTE — Telephone Encounter (Signed)
Spoke with patient he is aware labs have been ordered.

## 2023-09-19 ENCOUNTER — Other Ambulatory Visit: Payer: Self-pay

## 2023-09-19 DIAGNOSIS — I10 Essential (primary) hypertension: Secondary | ICD-10-CM

## 2023-09-20 LAB — COMPREHENSIVE METABOLIC PANEL
ALT: 21 [IU]/L (ref 0–44)
AST: 25 [IU]/L (ref 0–40)
Albumin: 4.3 g/dL (ref 3.9–4.9)
Alkaline Phosphatase: 74 [IU]/L (ref 44–121)
BUN/Creatinine Ratio: 22 (ref 10–24)
BUN: 19 mg/dL (ref 8–27)
Bilirubin Total: 1.1 mg/dL (ref 0.0–1.2)
CO2: 25 mmol/L (ref 20–29)
Calcium: 9.9 mg/dL (ref 8.6–10.2)
Chloride: 101 mmol/L (ref 96–106)
Creatinine, Ser: 0.87 mg/dL (ref 0.76–1.27)
Globulin, Total: 2.9 g/dL (ref 1.5–4.5)
Glucose: 110 mg/dL — ABNORMAL HIGH (ref 70–99)
Potassium: 4.9 mmol/L (ref 3.5–5.2)
Sodium: 138 mmol/L (ref 134–144)
Total Protein: 7.2 g/dL (ref 6.0–8.5)
eGFR: 95 mL/min/{1.73_m2} (ref >59)

## 2023-09-20 LAB — CBC
Hematocrit: 47.6 % (ref 37.5–51.0)
Hemoglobin: 15.9 g/dL (ref 13.0–17.7)
MCH: 32.1 pg (ref 26.6–33.0)
MCHC: 33.4 g/dL (ref 31.5–35.7)
MCV: 96 fL (ref 79–97)
Platelets: 283 10*3/uL (ref 150–450)
RBC: 4.95 x10E6/uL (ref 4.14–5.80)
RDW: 12.5 % (ref 11.6–15.4)
WBC: 7.8 10*3/uL (ref 3.4–10.8)

## 2023-09-20 LAB — LIPID PANEL
Chol/HDL Ratio: 5.2 ratio — ABNORMAL HIGH (ref 0.0–5.0)
Cholesterol, Total: 228 mg/dL — ABNORMAL HIGH (ref 100–199)
HDL: 44 mg/dL (ref >39)
LDL Chol Calc (NIH): 162 mg/dL — ABNORMAL HIGH (ref 0–99)
Triglycerides: 121 mg/dL (ref 0–149)
VLDL Cholesterol Cal: 22 mg/dL (ref 5–40)

## 2023-09-22 NOTE — Progress Notes (Unsigned)
Patrick Berg Date of Birth: [birth year] Medical Record #[id number]  History of Present Illness: Patrick Berg is seen for follow up of palpitations and CAD.  He is status post  MI/BMS to the LAD and BMS to the LCX in April 2012. Follow up stress Myoview study in December 2013 showed normal perfusion with an ejection fraction of 51%. ETT done July 2020 was normal.  He was seen in April 2021 and was doing well. In August 2021 developed symptoms of palpitations. He notes a bump in his chest with pressure in his throat and a feel of a pause. This is most noticeable when lying down or sitting. May happen 3-4 times a minute. No syncope or dizziness. A little lightheaded. No racing. Seems to get better with taking potassium supplement. We did have him wear an event monitor which showed infrequent PACs and PVCs.   He was started on metoprolol which helped but does still have palpitations especially when stressed.  On follow up today he feels well. Still working and does a lot of walking. Has a gimpy knee now.  No chest pain. Is trying to get on Medicare. Doesn't have insurance currently. No dyspnea.    Current Outpatient Medications on File Prior to Visit  Medication Sig Dispense Refill   acetaminophen (TYLENOL) 500 MG tablet Take 1,000 mg by mouth every 6 (six) hours as needed. 2 tablets      amLODipine (NORVASC) 5 MG tablet Take 1 tablet (5 mg total) by mouth daily. 30 tablet 0   aspirin 81 MG EC tablet Take 81 mg by mouth daily.       Coenzyme Q10 (CO Q 10) 100 MG CAPS Take 1 capsule by mouth daily.     ezetimibe (ZETIA) 10 MG tablet Take 1 tablet (10 mg total) by mouth daily. 30 tablet 0   metoprolol succinate (TOPROL-XL) 25 MG 24 hr tablet Take 1 tablet (25 mg total) by mouth daily. Please call office to schedule an appt for further refills. Thank you 90 tablet 0   multivitamin (THERAGRAN) per tablet Take 1 tablet by mouth daily.       nitroGLYCERIN (NITROSTAT) 0.4 MG SL tablet Place 1 tablet (0.4  mg total) under the tongue every 5 (five) minutes as needed. 25 tablet 11   rosuvastatin (CRESTOR) 20 MG tablet Take 1 tablet (20 mg total) by mouth daily. 90 tablet 3   No current facility-administered medications on file prior to visit.    Allergies  Allergen Reactions   Ace Inhibitors     cough   Irbesartan Other (See Comments)    Insomnia, nervousness   Lipitor [Atorvastatin] Other (See Comments)    SEVERE LEG PAIN   Naproxen Nausea And Vomiting    Past Medical History:  Diagnosis Date   Arthralgia    Bradycardia    intolerant of bradycardia   Coronary artery disease    History of acute anterior wall MI 02/17/2011   BMS to the LAD   Hyperlipidemia    Myocardial infarction, anterior wall, subsequent care    S/P coronary artery stent placement 02/17/2011   BMS to the mid LCX   Ventricular dysfunction    left (mild); EF is 40% per cath 4/12; improved to 55% per echo in July 2012    Past Surgical History:  Procedure Laterality Date   CORONARY STENT PLACEMENT  02/17/2011   BMS to the LAD   CORONARY STENT PLACEMENT  03/15/11   BMS to the mid LCX  VASECTOMY      Social History   Tobacco Use  Smoking Status Never  Smokeless Tobacco Never    Social History   Substance and Sexual Activity  Alcohol Use No   Alcohol/week: 0.0 standard drinks of alcohol    Family History  Problem Relation Age of Onset   Anxiety disorder Father    Heart attack Father    Heart failure Father    Anemia Mother    Cancer Mother        liver / lung   Anxiety disorder Mother    Hyperlipidemia Mother     Review of Systems: The review of systems is per the HPI.  All other systems were reviewed and are negative.  Physical Exam: There were no vitals taken for this visit. GENERAL:  Well appearing  WM in NAD HEENT:  PERRL, EOMI, sclera are clear. Oropharynx is clear. NECK:  No jugular venous distention, carotid upstroke brisk and symmetric, no bruits, no thyromegaly or  adenopathy LUNGS:  Clear to auscultation bilaterally CHEST:  Unremarkable HEART:  RRR,  PMI not displaced or sustained,S1 and S2 within normal limits, no S3, no S4: no clicks, no rubs, no murmurs ABD:  Soft, nontender. BS +, no masses or bruits. No hepatomegaly, no splenomegaly EXT:  2 + pulses throughout, no edema, no cyanosis no clubbing SKIN:  Warm and dry.  No rashes NEURO:  Alert and oriented x 3. Cranial nerves II through XII intact. PSYCH:  Cognitively intact   LABORATORY DATA:  Echo: 11/30/15: Study Conclusions   - Left ventricle: The cavity size was normal. There was mild focal   basal hypertrophy of the septum. Systolic function was normal.   The estimated ejection fraction was in the range of 55% to 60%.   There is hypokinesis of the septal myocardium. Doppler parameters   are consistent with abnormal left ventricular relaxation (grade 1   diastolic dysfunction). - Mitral valve: Calcified annulus.   Impressions:   - Septal hypokinesis noted on apical four chamber view; overall   preserved LV function; grade 1 diastolic dysfunction.   Lab Results  Component Value Date   WBC 7.8 09/19/2023   HGB 15.9 09/19/2023   HCT 47.6 09/19/2023   PLT 283 09/19/2023   GLUCOSE 110 (H) 09/19/2023   CHOL 228 (H) 09/19/2023   TRIG 121 09/19/2023   HDL 44 09/19/2023   LDLCALC 162 (H) 09/19/2023   ALT 21 09/19/2023   AST 25 09/19/2023   NA 138 09/19/2023   K 4.9 09/19/2023   CL 101 09/19/2023   CREATININE 0.87 09/19/2023   BUN 19 09/19/2023   CO2 25 09/19/2023   TSH 2.113 02/17/2011   INR 1.2 (H) 03/07/2011   HGBA1C  02/17/2011    5.3 (NOTE)                                                                       According to the ADA Clinical Practice Recommendations for 2011, when HbA1c is used as a screening test:   >=6.5%   Diagnostic of Diabetes Mellitus           (if abnormal result  is confirmed)  5.7-6.4%   Increased risk of developing Diabetes Mellitus  References:Diagnosis and Classification of Diabetes Mellitus,Diabetes Care,2011,34(Suppl 1):S62-S69 and Standards of Medical Care in         Diabetes - 2011,Diabetes Care,2011,34  (Suppl 1):S11-S61.   Ecg today shows NSR rate 70.compared to prior Ecg there is now poor R wave progression in leads V1-4.    I have personally reviewed and interpreted this study.  ETT 07/04/17: Study Highlights   Blood pressure demonstrated a normal response to exercise. There was no ST segment deviation noted during stress. No T wave inversion was noted during stress. Excellent exercise capacity. Negative, adequate stress test.     ETT 05/28/19: Study Highlights  Blood pressure demonstrated a normal response to exercise. Upsloping ST segment depression was noted during stress in the II, III, aVF, V5 and V6 leads, and returning to baseline after 1-5 minutes of recovery. Excellent exercise capacity. Normal adequate stress test.   Event monitor 08/24/20: Study Highlights  Normal sinus rhythm Rare PACs Rare isolated PVCs One 5 beat run NSVT Symptoms correlate with PACs and PVCs.    Assessment / Plan: 1. Coronary disease status post myocardial infarction with subsequent stenting of the LAD and left circumflex coronary in 2012 with bare-metal stents. Normal Myoview study in December 2013. Normal ETT in May 2016,  August 2018, and July 2020. He is asymptomatic. There are some new changes on Ecg with poor R wave progression. We discussed doing a follow up stress test. It would need to be a pharmacologic Nuclear study since his is unable to walk on a treadmill with his knee. This would be a significant expense since he has no insurance. Will refer to Social work to help apply for [employer].  We will continue with aspirin and statin therapy. Once he gets insurance we can arrange for stress testing.   2. Dyslipidemia-on Crestor and Zetia. LDL in low 80s. Unable to tolerate higher statin dose. Will continue Crestor 20  mg and Zetia. Focus more on healthy diet and weight loss.   3. HTN- controlled on amlodipine, metoprolol  4. Palpitations secondary to  PVCs or PACs. Infrequent by event monitor. Will continue low dose Toprol XL.   Follow up in one year

## 2023-09-23 ENCOUNTER — Encounter: Payer: Self-pay | Admitting: Cardiology

## 2023-09-23 ENCOUNTER — Ambulatory Visit: Payer: Medicare Other | Attending: Cardiology | Admitting: Cardiology

## 2023-09-23 VITALS — BP 132/80 | HR 65 | Ht 70.0 in | Wt 214.0 lb

## 2023-09-23 DIAGNOSIS — E7800 Pure hypercholesterolemia, unspecified: Secondary | ICD-10-CM | POA: Diagnosis present

## 2023-09-23 DIAGNOSIS — M791 Myalgia, unspecified site: Secondary | ICD-10-CM | POA: Insufficient documentation

## 2023-09-23 DIAGNOSIS — T466X5D Adverse effect of antihyperlipidemic and antiarteriosclerotic drugs, subsequent encounter: Secondary | ICD-10-CM

## 2023-09-23 DIAGNOSIS — I2510 Atherosclerotic heart disease of native coronary artery without angina pectoris: Secondary | ICD-10-CM | POA: Diagnosis present

## 2023-09-23 DIAGNOSIS — T466X5A Adverse effect of antihyperlipidemic and antiarteriosclerotic drugs, initial encounter: Secondary | ICD-10-CM | POA: Insufficient documentation

## 2023-09-23 DIAGNOSIS — I10 Essential (primary) hypertension: Secondary | ICD-10-CM | POA: Insufficient documentation

## 2023-09-23 MED ORDER — EZETIMIBE 10 MG PO TABS: 10.0000 mg | ORAL_TABLET | Freq: Every day | ORAL | 3 refills | Status: DC

## 2023-09-23 MED ORDER — METOPROLOL SUCCINATE ER 25 MG PO TB24: 25.0000 mg | ORAL_TABLET | Freq: Every day | ORAL | 3 refills | Status: DC

## 2023-09-23 MED ORDER — AMLODIPINE BESYLATE 5 MG PO TABS: 5.0000 mg | ORAL_TABLET | Freq: Every day | ORAL | 3 refills | Status: DC

## 2023-09-23 NOTE — Patient Instructions (Signed)
Medication Instructions:  Stop the Crestor  Continue taking all other medications  *If you need a refill on your cardiac medications before your next appointment, please call your pharmacy*   Lab Work: None ordered  If you have labs (blood work) drawn today and your tests are completely normal, you will receive your results only by: MyChart Message (if you have MyChart) OR A paper copy in the mail If you have any lab test that is abnormal or we need to change your treatment, we will call you to review the results.   Testing/Procedures: None needed    Follow-Up: At [facility], you and your health needs are our priority.  As part of our continuing mission to provide you with exceptional heart care, we have created designated Provider Care Teams.  These Care Teams include your primary Cardiologist (physician) and Advanced Practice Providers (APPs -  Physician Assistants and Nurse Practitioners) who all work together to provide you with the care you need, when you need it.      Your next appointment:   1 year(s)  Provider:   Dr. Swaziland

## 2023-11-07 ENCOUNTER — Encounter: Payer: Self-pay | Admitting: Pharmacist

## 2023-11-07 ENCOUNTER — Ambulatory Visit: Payer: Medicare Other | Attending: Cardiovascular Disease | Admitting: Pharmacist

## 2023-11-07 DIAGNOSIS — R002 Palpitations: Secondary | ICD-10-CM

## 2023-11-07 DIAGNOSIS — E7800 Pure hypercholesterolemia, unspecified: Secondary | ICD-10-CM | POA: Diagnosis present

## 2023-11-07 DIAGNOSIS — I2510 Atherosclerotic heart disease of native coronary artery without angina pectoris: Secondary | ICD-10-CM

## 2023-11-07 DIAGNOSIS — T466X5D Adverse effect of antihyperlipidemic and antiarteriosclerotic drugs, subsequent encounter: Secondary | ICD-10-CM | POA: Diagnosis not present

## 2023-11-07 DIAGNOSIS — T466X5A Adverse effect of antihyperlipidemic and antiarteriosclerotic drugs, initial encounter: Secondary | ICD-10-CM | POA: Insufficient documentation

## 2023-11-07 DIAGNOSIS — G720 Drug-induced myopathy: Secondary | ICD-10-CM | POA: Diagnosis present

## 2023-11-07 NOTE — Patient Instructions (Addendum)
It was nice meeting you today  We would like your LDL (bad cholesterol) to be less than 70  Please continue your ezetimibe 10mg  once a day  The medication we discussed today is called Repatha, which is an injection you would take once every 2 weeks  I will complete the prior authorization for you in January and then send the prescription in to [facility]  Once you start the new medication we will recheck your cholesterol levels and A1c in 2-3 months  Please let me know if you have any questions  Laural Golden, PharmD, BCACP, CDCES, CPP [address] [place], [place], [address] Phone: [phone], Fax: [phone]

## 2023-11-07 NOTE — Progress Notes (Signed)
Patient ID: Patrick Berg                 DOB: 24-Feb-1956                    MRN: [id number]     HPI: Izach Dowlin is a 67 y.o. male patient referred to lipid clinic by Dr Swaziland. PMH is significant for CAD, MI, HTN, HLD, and statin intolerance.  Patient presents today to discuss lipid management. Currently on Zetia monotherapy. Had anterior MI in 2012. Has tried both atorvastatin and rosuvastatin. Both cause leg pain and back pain which take about 2 weeks to resolve after discontinuing. He works for a [employer] so is physically active and this interferes with his work. He plans on retiring in 2 months.  Wife works as a Clinical biochemist. Has 2 granddaughters who he enjoys being with.  Current Medications:  Ezetimibe 10mg  daily  Intolerances:  Atorvastatin Rosuvastatin  Risk Factors:  CAD Hx of MI  LDL goal: <70  Labs: TC 228, Trigs 121, HDL 44, LDL 162 (09/19/23 on Zetia)  Past Medical History:  Diagnosis Date   Arthralgia    Bradycardia    intolerant of bradycardia   Coronary artery disease    History of acute anterior wall MI 02/17/2011   BMS to the LAD   Hyperlipidemia    Myocardial infarction, anterior wall, subsequent care    S/P coronary artery stent placement 02/17/2011   BMS to the mid LCX   Ventricular dysfunction    left (mild); EF is 40% per cath 4/12; improved to 55% per echo in July 2012    Current Outpatient Medications on File Prior to Visit  Medication Sig Dispense Refill   acetaminophen (TYLENOL) 500 MG tablet Take 1,000 mg by mouth every 6 (six) hours as needed. 2 tablets      amLODipine (NORVASC) 5 MG tablet Take 1 tablet (5 mg total) by mouth daily. 90 tablet 3   aspirin 81 MG EC tablet Take 81 mg by mouth daily.       Coenzyme Q10 (CO Q 10) 100 MG CAPS Take 1 capsule by mouth daily.     ezetimibe (ZETIA) 10 MG tablet Take 1 tablet (10 mg total) by mouth daily. 90 tablet 3   metoprolol succinate (TOPROL-XL) 25 MG 24 hr tablet Take 1 tablet (25 mg  total) by mouth daily. Please call office to schedule an appt for further refills. Thank you 90 tablet 3   multivitamin (THERAGRAN) per tablet Take 1 tablet by mouth daily.       nitroGLYCERIN (NITROSTAT) 0.4 MG SL tablet Place 1 tablet (0.4 mg total) under the tongue every 5 (five) minutes as needed. 25 tablet 11   No current facility-administered medications on file prior to visit.    Allergies  Allergen Reactions   Ace Inhibitors     cough   Irbesartan Other (See Comments)    Insomnia, nervousness   Lipitor [Atorvastatin] Other (See Comments)    SEVERE LEG PAIN   Naproxen Nausea And Vomiting    Assessment/Plan:  1. Hyperlipidemia - Patient last LDL 162 which is above goal of <70. LDL in 70s-80s while on statins but myalgias interfere with his work. Recommend addition of PCSK9i therapy to Zetia.  Using femo pen, educated patient on mechanism of action, storage,site selection, administration, and possible adverse effects. Patient plan changing in January so will complete PA in 2025. Recheck LDL and A1c (per patient request) in 2-3 months  after starting.  Continue Zetia 10mg  daily Start repatha 140mg  in 2025 Recheck lipid panel and A1c in 2-3 months  Laural Golden, PharmD, BCACP, CDCES, CPP [address], [address] [place], [place], [address] Phone: [phone], Fax: [phone]

## 2023-11-20 ENCOUNTER — Telehealth: Payer: Self-pay | Admitting: Pharmacy Technician

## 2023-11-20 ENCOUNTER — Other Ambulatory Visit (HOSPITAL_COMMUNITY): Payer: Self-pay

## 2023-11-20 DIAGNOSIS — E7800 Pure hypercholesterolemia, unspecified: Secondary | ICD-10-CM

## 2023-11-20 DIAGNOSIS — I2510 Atherosclerotic heart disease of native coronary artery without angina pectoris: Secondary | ICD-10-CM

## 2023-11-20 NOTE — Telephone Encounter (Signed)
 Pharmacy Patient Advocate Encounter   Received notification from Physician's Office that prior authorization for repatha  is required/requested.   Insurance verification completed.   The patient is insured through [employer] .   Per test claim: PA required; PA submitted to above mentioned insurance via CoverMyMeds Key/confirmation #/EOC [facility] Status is pending

## 2023-11-20 NOTE — Telephone Encounter (Signed)
-----   Message from Cheree Ditto sent at 11/07/2023  9:12 AM EST ----- Regarding: PA Please complete Repatha PA. Has new plan on 11/19/23

## 2023-11-20 NOTE — Telephone Encounter (Signed)
 Pharmacy Patient Advocate Encounter  Received notification from [employer] that Prior Authorization for repatha  has been APPROVED from 11/19/23 to 11/19/24. Ran test claim, Copay is $496.58 one month (deductible). This test claim was processed through [facility]- copay amounts may vary at other pharmacies due to pharmacy/plan contracts, or as the patient moves through the different stages of their insurance plan.   PA #/Case ID/Reference #: [id number]

## 2023-11-21 ENCOUNTER — Other Ambulatory Visit (HOSPITAL_COMMUNITY): Payer: Self-pay

## 2023-11-21 ENCOUNTER — Other Ambulatory Visit: Payer: Self-pay

## 2023-11-21 MED ORDER — REPATHA SURECLICK 140 MG/ML ~~LOC~~ SOAJ
1.0000 mL | SUBCUTANEOUS | 3 refills | Status: DC
  Filled 2023-11-21: qty 6, 84d supply, fill #0
  Filled 2024-02-10: qty 6, 84d supply, fill #1
  Filled 2024-05-13: qty 6, 84d supply, fill #2
  Filled 2024-08-04: qty 6, 84d supply, fill #3

## 2023-11-21 NOTE — Telephone Encounter (Signed)
 Repatha approved. Enrolled in [facility].  Card No. [id number]  BIN [id number]  PCN [id number]  PC Group [id number]  Rx sent to [facility]. LMOM on wifes phone.

## 2023-11-21 NOTE — Addendum Note (Signed)
 Addended by: Cheree Ditto on: 11/21/2023 03:07 PM   Modules accepted: Orders

## 2023-11-25 ENCOUNTER — Other Ambulatory Visit (HOSPITAL_COMMUNITY): Payer: Self-pay

## 2023-11-26 ENCOUNTER — Other Ambulatory Visit (HOSPITAL_COMMUNITY): Payer: Self-pay

## 2023-11-26 NOTE — Telephone Encounter (Signed)
 Received call from patient that [employer] grant could not be processed. Called pharmacy and gave numbers over phone

## 2023-11-28 ENCOUNTER — Other Ambulatory Visit (HOSPITAL_COMMUNITY): Payer: Self-pay

## 2024-01-22 ENCOUNTER — Telehealth: Payer: Self-pay | Admitting: Cardiology

## 2024-01-22 DIAGNOSIS — I2510 Atherosclerotic heart disease of native coronary artery without angina pectoris: Secondary | ICD-10-CM

## 2024-01-22 DIAGNOSIS — E7800 Pure hypercholesterolemia, unspecified: Secondary | ICD-10-CM

## 2024-01-22 NOTE — Telephone Encounter (Signed)
 Pt c/o medication issue:  1. Name of Medication:   Evolocumab (REPATHA SURECLICK) 140 MG/ML SOAJ    2. How are you currently taking this medication (dosage and times per day)? As written   3. Are you having a reaction (difficulty breathing--STAT)? No   4. What is your medication issue? Pt called in stating he is having muscle pains and spasms and fatigue with this med. Please advise.

## 2024-01-22 NOTE — Telephone Encounter (Signed)
 Spoke to patient, he took his 5th dose yesterday. Muscle aches affecting his walking ability at work. Asked to hold next dose and get the f/u lipid lab done next week.   We will assess response and depends on the lab result amy consider Repatha 140 mg once a month instead of every 14 days.

## 2024-01-30 LAB — LIPID PANEL
Chol/HDL Ratio: 3.6 ratio (ref 0.0–5.0)
Cholesterol, Total: 153 mg/dL (ref 100–199)
HDL: 43 mg/dL (ref >39)
LDL Chol Calc (NIH): 79 mg/dL (ref 0–99)
Triglycerides: 182 mg/dL — ABNORMAL HIGH (ref 0–149)
VLDL Cholesterol Cal: 31 mg/dL (ref 5–40)

## 2024-02-10 ENCOUNTER — Other Ambulatory Visit (HOSPITAL_COMMUNITY): Payer: Self-pay

## 2024-02-10 ENCOUNTER — Telehealth: Payer: Self-pay | Admitting: Cardiology

## 2024-02-10 NOTE — Telephone Encounter (Signed)
 Pt c/o medication issue:  1. Name of Medication:   Evolocumab (REPATHA SURECLICK) 140 MG/ML SOAJ   2. How are you currently taking this medication (dosage and times per day)?   Not taking  3. Are you having a reaction (difficulty breathing--STAT)?   4. What is your medication issue?   Patient stated he has been off this medication and is calling back to follow-up as requested.  Patient wants to know next steps.

## 2024-02-10 NOTE — Telephone Encounter (Signed)
 Spoke to pt, discussed Lipid lab LDLc 79 (01/30/2024) was 162 (09/2023) went down on Repatha and Zetia. Patient was having trouble tolerating Repatha however he continued till 5 injections. He held it for last dose and his myalgia improved. 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 Will retry and reach out to [place] if not tolerated, may consider switching Praluent or Leqvio.

## 2024-05-13 ENCOUNTER — Other Ambulatory Visit (HOSPITAL_COMMUNITY): Payer: Self-pay

## 2024-07-15 ENCOUNTER — Telehealth: Payer: Self-pay | Admitting: Cardiology

## 2024-07-15 ENCOUNTER — Encounter: Payer: Self-pay | Admitting: Family Medicine

## 2024-07-15 DIAGNOSIS — E7800 Pure hypercholesterolemia, unspecified: Secondary | ICD-10-CM

## 2024-07-15 NOTE — Telephone Encounter (Signed)
 Error

## 2024-07-15 NOTE — Telephone Encounter (Signed)
Patient is requesting orders for lab work. 

## 2024-07-16 NOTE — Telephone Encounter (Signed)
 Called pt and LVM. I assume he is wanting lipid labs, but will confirm before ordering.

## 2024-07-20 NOTE — Telephone Encounter (Signed)
 Pt returning call

## 2024-08-04 ENCOUNTER — Other Ambulatory Visit (HOSPITAL_COMMUNITY): Payer: Self-pay

## 2024-08-30 ENCOUNTER — Telehealth: Payer: Self-pay | Admitting: Cardiology

## 2024-08-30 DIAGNOSIS — R7301 Impaired fasting glucose: Secondary | ICD-10-CM

## 2024-08-30 NOTE — Telephone Encounter (Signed)
 Patient would like to have A1C added to his labs request. Please advise

## 2024-08-31 NOTE — Telephone Encounter (Signed)
 Patient made aware A1C and lipid panel ordered

## 2024-09-02 ENCOUNTER — Other Ambulatory Visit (HOSPITAL_COMMUNITY): Payer: Self-pay

## 2024-09-02 ENCOUNTER — Telehealth: Payer: Self-pay | Admitting: Pharmacy Technician

## 2024-09-02 ENCOUNTER — Ambulatory Visit: Payer: Self-pay | Admitting: Pharmacist

## 2024-09-02 LAB — LIPID PANEL
Chol/HDL Ratio: 3.9 ratio (ref 0.0–5.0)
Cholesterol, Total: 163 mg/dL (ref 100–199)
HDL: 42 mg/dL (ref >39)
LDL Chol Calc (NIH): 72 mg/dL (ref 0–99)
Triglycerides: 309 mg/dL — ABNORMAL HIGH (ref 0–149)
VLDL Cholesterol Cal: 49 mg/dL — ABNORMAL HIGH (ref 5–40)

## 2024-09-02 LAB — HEMOGLOBIN A1C
Est. average glucose Bld gHb Est-mCnc: 137 mg/dL
Hgb A1c MFr Bld: 6.4 % — ABNORMAL HIGH (ref 4.8–5.6)

## 2024-09-02 NOTE — Telephone Encounter (Signed)
 Lab discussed with patient. lifestyle changes, such as a healthy diet with reduced sugar and saturated fats, regular exercise, and weight loss discussed in detail. Pt does not drink alcohol Will assess coverage for Vascepa.

## 2024-09-02 NOTE — Telephone Encounter (Signed)
   Ran test claim for icosapent. For a 30 day supply and the co-pay is 19.09 . PA is not needed at this time. Nothing saying this is a transition fill.   This test claim was processed through [facility]- copay amounts may vary at other pharmacies due to pharmacy/plan contracts, or as the patient moves through the different stages of their insurance plan.

## 2024-09-03 MED ORDER — ICOSAPENT ETHYL 1 G PO CAPS: 2.0000 g | ORAL_CAPSULE | Freq: Two times a day (BID) | ORAL | 11 refills | Status: DC

## 2024-09-03 NOTE — Addendum Note (Signed)
 Addended by: Kyira Volkert K on: 09/03/2024 07:36 AM   Modules accepted: Orders

## 2024-09-04 NOTE — Progress Notes (Unsigned)
 Patrick Berg Date of Birth: [birth year] Medical Record #[id number]  History of Present Illness: Patrick Berg is seen for follow up of palpitations and CAD.  He is status post  MI/BMS to the LAD and BMS to the LCX in April 2012. Follow up stress Myoview study in December 2013 showed normal perfusion with an ejection fraction of 51%. ETT done July 2020 was normal.  He was seen in April 2021 and was doing well. In August 2021 developed symptoms of palpitations. He notes a bump in his chest with pressure in his throat and a feel of a pause. This is most noticeable when lying down or sitting. May happen 3-4 times a minute. No syncope or dizziness. A little lightheaded. No racing. Seems to get better with taking potassium supplement. We did have him wear an event monitor which showed infrequent PACs and PVCs.   He was started on metoprolol  which helped but does still have palpitations especially when stressed.  On follow up today he feels well. Still working and does a lot of walking.  No chest pain. 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 He does now have Medicare and Part D    Current Outpatient Medications on File Prior to Visit  Medication Sig Dispense Refill   acetaminophen (TYLENOL) 500 MG tablet Take 1,000 mg by mouth every 6 (six) hours as needed. 2 tablets      amLODipine  (NORVASC ) 5 MG tablet Take 1 tablet (5 mg total) by mouth daily. 90 tablet 3   aspirin 81 MG EC tablet Take 81 mg by mouth daily.       Coenzyme Q10 (CO Q 10) 100 MG CAPS Take 1 capsule by mouth daily.     Evolocumab  (REPATHA  SURECLICK) 140 MG/ML SOAJ Inject 140 mg into the skin every 14 (fourteen) days. 6 mL 3   ezetimibe  (ZETIA ) 10 MG tablet Take 1 tablet (10 mg total) by mouth daily. 90 tablet 3   icosapent Ethyl (VASCEPA) 1 g capsule Take 2 capsules (2 g total) by mouth 2 (two) times daily. 120 capsule 11   metoprolol  succinate (TOPROL -XL) 25 MG 24 hr tablet Take 1 tablet (25 mg total) by mouth daily. Please call office  to schedule an appt for further refills. Thank you 90 tablet 3   multivitamin (THERAGRAN) per tablet Take 1 tablet by mouth daily.       nitroGLYCERIN  (NITROSTAT ) 0.4 MG SL tablet Place 1 tablet (0.4 mg total) under the tongue every 5 (five) minutes as needed. 25 tablet 11   No current facility-administered medications on file prior to visit.    Allergies  Allergen Reactions   Ace Inhibitors     cough   Crestor  [Rosuvastatin  Calcium ] Other (See Comments)    myalgias   Irbesartan  Other (See Comments)    Insomnia, nervousness   Lipitor [Atorvastatin ] Other (See Comments)    SEVERE LEG PAIN   Naproxen Nausea And Vomiting    Past Medical History:  Diagnosis Date   Arthralgia    Bradycardia    intolerant of bradycardia   Coronary artery disease    History of acute anterior wall MI 02/17/2011   BMS to the LAD   Hyperlipidemia    Myocardial infarction, anterior wall, subsequent care    S/P coronary artery stent placement 02/17/2011   BMS to the mid LCX   Ventricular dysfunction    left (mild); EF is 40% per cath 4/12; improved to 55% per echo in July 2012    Past  Surgical History:  Procedure Laterality Date   CORONARY STENT PLACEMENT  02/17/2011   BMS to the LAD   CORONARY STENT PLACEMENT  03/15/11   BMS to the mid LCX   VASECTOMY      Social History   Tobacco Use  Smoking Status Never  Smokeless Tobacco Never    Social History   Substance and Sexual Activity  Alcohol Use No   Alcohol/week: 0.0 standard drinks of alcohol    Family History  Problem Relation Age of Onset   Anxiety disorder Father    Heart attack Father    Heart failure Father    Anemia Mother    Cancer Mother        liver / lung   Anxiety disorder Mother    Hyperlipidemia Mother     Review of Systems: The review of systems is per the HPI.  All other systems were reviewed and are negative.  Physical Exam: There were no vitals taken for this visit. GENERAL:  Well appearing  WM in  NAD HEENT:  PERRL, EOMI, sclera are clear. Oropharynx is clear. NECK:  No jugular venous distention, carotid upstroke brisk and symmetric, no bruits, no thyromegaly or adenopathy LUNGS:  Clear to auscultation bilaterally CHEST:  Unremarkable HEART:  RRR,  PMI not displaced or sustained,S1 and S2 within normal limits, no S3, no S4: no clicks, no rubs, no murmurs ABD:  Soft, nontender. BS +, no masses or bruits. No hepatomegaly, no splenomegaly EXT:  2 + pulses throughout, no edema, no cyanosis no clubbing SKIN:  Warm and dry.  No rashes NEURO:  Alert and oriented x 3. Cranial nerves II through XII intact. PSYCH:  Cognitively intact   LABORATORY DATA:  Echo: 11/30/15: Study Conclusions   - Left ventricle: The cavity size was normal. There was mild focal   basal hypertrophy of the septum. Systolic function was normal.   The estimated ejection fraction was in the range of 55% to 60%.   There is hypokinesis of the septal myocardium. Doppler parameters   are consistent with abnormal left ventricular relaxation (grade 1   diastolic dysfunction). - Mitral valve: Calcified annulus.   Impressions:   - Septal hypokinesis noted on apical four chamber view; overall   preserved LV function; grade 1 diastolic dysfunction.   Lab Results  Component Value Date   WBC 7.8 09/19/2023   HGB 15.9 09/19/2023   HCT 47.6 09/19/2023   PLT 283 09/19/2023   GLUCOSE 110 (H) 09/19/2023   CHOL 163 09/01/2024   TRIG 309 (H) 09/01/2024   HDL 42 09/01/2024   LDLCALC 72 09/01/2024   ALT 21 09/19/2023   AST 25 09/19/2023   NA 138 09/19/2023   K 4.9 09/19/2023   CL 101 09/19/2023   CREATININE 0.87 09/19/2023   BUN 19 09/19/2023   CO2 25 09/19/2023   TSH 2.113 02/17/2011   INR 1.2 (H) 03/07/2011   HGBA1C 6.4 (H) 09/01/2024     ETT 07/04/17: Study Highlights   Blood pressure demonstrated a normal response to exercise. There was no ST segment deviation noted during stress. No T wave inversion was  noted during stress. Excellent exercise capacity. Negative, adequate stress test.     ETT 05/28/19: Study Highlights  Blood pressure demonstrated a normal response to exercise. Upsloping ST segment depression was noted during stress in the II, III, aVF, V5 and V6 leads, and returning to baseline after 1-5 minutes of recovery. Excellent exercise capacity. Normal adequate stress test.  Event monitor 08/24/20: Study Highlights  Normal sinus rhythm Rare PACs Rare isolated PVCs One 5 beat run NSVT Symptoms correlate with PACs and PVCs.    Assessment / Plan: 1. Coronary disease status post myocardial infarction with subsequent stenting of the LAD and left circumflex coronary in 2012 with bare-metal stents. Normal Myoview study in December 2013. Normal ETT in May 2016,  August 2018, and July 2020. He is asymptomatic. Continue medical management  2. Dyslipidemia-on Crestor  and Zetia . LDL in low 80s. Unable to tolerate statins due to myalgias. LDL repeat 162.  Will continue Zetia . Now that he has insurance will have him see Pharm D to consider Repatha . Focus on healthy diet and weight loss.   3. HTN- controlled on amlodipine , metoprolol   4. Palpitations secondary to  PVCs or PACs. Infrequent by event monitor. Will continue low dose Toprol  XL.   5. Mildly elevated blood sugar. Would recommend checking A1c on next blood draw. Focus on avoiding concentrated sweets   Follow up in one year

## 2024-09-08 ENCOUNTER — Ambulatory Visit: Payer: Self-pay | Admitting: Pharmacist

## 2024-09-10 ENCOUNTER — Ambulatory Visit: Attending: Cardiology | Admitting: Cardiology

## 2024-09-10 ENCOUNTER — Encounter: Payer: Self-pay | Admitting: Cardiology

## 2024-09-10 VITALS — BP 138/70 | HR 77 | Resp 16 | Ht 69.0 in | Wt 227.6 lb

## 2024-09-10 DIAGNOSIS — I10 Essential (primary) hypertension: Secondary | ICD-10-CM | POA: Insufficient documentation

## 2024-09-10 DIAGNOSIS — I2510 Atherosclerotic heart disease of native coronary artery without angina pectoris: Secondary | ICD-10-CM | POA: Diagnosis present

## 2024-09-10 DIAGNOSIS — E7800 Pure hypercholesterolemia, unspecified: Secondary | ICD-10-CM | POA: Diagnosis present

## 2024-09-10 MED ORDER — ICOSAPENT ETHYL 1 G PO CAPS: 2.0000 g | ORAL_CAPSULE | Freq: Two times a day (BID) | ORAL | 11 refills | Status: AC

## 2024-09-10 MED ORDER — EZETIMIBE 10 MG PO TABS: 10.0000 mg | ORAL_TABLET | Freq: Every day | ORAL | 3 refills | Status: AC

## 2024-09-10 MED ORDER — AMLODIPINE BESYLATE 5 MG PO TABS: 5.0000 mg | ORAL_TABLET | Freq: Every day | ORAL | 3 refills | Status: AC

## 2024-09-10 MED ORDER — METOPROLOL SUCCINATE ER 25 MG PO TB24: 25.0000 mg | ORAL_TABLET | Freq: Every day | ORAL | 3 refills | Status: AC

## 2024-09-10 NOTE — Patient Instructions (Addendum)

## 2024-09-13 ENCOUNTER — Telehealth: Payer: Self-pay | Admitting: Pharmacy Technician

## 2024-09-13 NOTE — Telephone Encounter (Signed)
     Will enroll then

## 2024-10-18 ENCOUNTER — Other Ambulatory Visit (HOSPITAL_COMMUNITY): Payer: Self-pay

## 2024-10-18 NOTE — Telephone Encounter (Signed)
 Patient Advocate Encounter   The patient was approved for a [employer] grant that will help cover the cost of REPATHA  Total amount awarded, 2500.  Effective: 10/21/24 - 10/20/25   APW:[id number] ERW:[id number] Hmnle:[id number] PI:[id number] [employer] ID: [id number]   Pharmacy provided with approval and processing information. Patient informed via [facility]   In [place]

## 2024-10-28 ENCOUNTER — Other Ambulatory Visit (HOSPITAL_COMMUNITY): Payer: Self-pay

## 2024-10-28 ENCOUNTER — Other Ambulatory Visit: Payer: Self-pay | Admitting: Cardiology

## 2024-10-28 DIAGNOSIS — I2510 Atherosclerotic heart disease of native coronary artery without angina pectoris: Secondary | ICD-10-CM

## 2024-10-28 DIAGNOSIS — E7800 Pure hypercholesterolemia, unspecified: Secondary | ICD-10-CM

## 2024-10-28 NOTE — Telephone Encounter (Signed)
 Refill Request.

## 2024-10-29 ENCOUNTER — Other Ambulatory Visit: Payer: Self-pay

## 2024-10-29 ENCOUNTER — Other Ambulatory Visit (HOSPITAL_COMMUNITY): Payer: Self-pay

## 2024-10-29 MED ORDER — REPATHA SURECLICK 140 MG/ML ~~LOC~~ SOAJ
1.0000 mL | SUBCUTANEOUS | 3 refills | Status: AC
  Filled 2024-10-29: qty 6, 84d supply, fill #0

## 2024-11-29 ENCOUNTER — Telehealth: Payer: Self-pay | Admitting: Cardiology

## 2024-11-29 NOTE — Telephone Encounter (Signed)
 Pt c/o medication issue:  1. Name of Medication: icosapent  Ethyl (VASCEPA ) 1 g capsule   2. How are you currently taking this medication (dosage and times per day)? As written  3. Are you having a reaction (difficulty breathing--STAT)? no  4. What is your medication issue? Pt new insurance does not cover generic , pt looking for another option.   Please advise.

## 2024-11-30 ENCOUNTER — Other Ambulatory Visit (HOSPITAL_COMMUNITY): Payer: Self-pay

## 2024-11-30 ENCOUNTER — Telehealth: Payer: Self-pay | Admitting: Pharmacy Technician

## 2024-11-30 NOTE — Telephone Encounter (Signed)
" ° °  Insurance will pay for brand for 333.58 for 30 days. Patient has a grant that will cover that cost     Effective: 10/21/24 - 10/20/25   APW:[id number] ERW:[id number] Hmnle:[id number] PI:[id number] [employer] ID: [id number] "
